# Patient Record
Sex: Female | Born: 1975 | Race: White | Hispanic: No | Marital: Married | Smoking: Never smoker
Health system: Southern US, Community
[De-identification: ages and names within clinical notes are randomized; demographics above are authoritative.]

## PROBLEM LIST (undated history)

## (undated) DIAGNOSIS — F419 Anxiety disorder, unspecified: Secondary | ICD-10-CM

---

## 2015-05-15 ENCOUNTER — Inpatient Hospital Stay (HOSPITAL_COMMUNITY)
Admission: EM | Admit: 2015-05-15 | Discharge: 2015-05-17 | DRG: 917 | Payer: 59 | Attending: Pulmonary Disease | Admitting: Pulmonary Disease

## 2015-05-15 ENCOUNTER — Encounter (HOSPITAL_COMMUNITY): Payer: Self-pay | Admitting: Nurse Practitioner

## 2015-05-15 ENCOUNTER — Emergency Department (HOSPITAL_COMMUNITY): Payer: 59

## 2015-05-15 DIAGNOSIS — Y907 Blood alcohol level of 200-239 mg/100 ml: Secondary | ICD-10-CM | POA: Diagnosis present

## 2015-05-15 DIAGNOSIS — T50901A Poisoning by unspecified drugs, medicaments and biological substances, accidental (unintentional), initial encounter: Secondary | ICD-10-CM | POA: Diagnosis not present

## 2015-05-15 DIAGNOSIS — J040 Acute laryngitis: Secondary | ICD-10-CM | POA: Diagnosis present

## 2015-05-15 DIAGNOSIS — Z820 Family history of epilepsy and other diseases of the nervous system: Secondary | ICD-10-CM | POA: Diagnosis not present

## 2015-05-15 DIAGNOSIS — T43502A Poisoning by unspecified antipsychotics and neuroleptics, intentional self-harm, initial encounter: Secondary | ICD-10-CM

## 2015-05-15 DIAGNOSIS — I4581 Long QT syndrome: Secondary | ICD-10-CM | POA: Diagnosis present

## 2015-05-15 DIAGNOSIS — T1491 Suicide attempt: Secondary | ICD-10-CM | POA: Diagnosis not present

## 2015-05-15 DIAGNOSIS — R4182 Altered mental status, unspecified: Secondary | ICD-10-CM | POA: Diagnosis present

## 2015-05-15 DIAGNOSIS — T43501A Poisoning by unspecified antipsychotics and neuroleptics, accidental (unintentional), initial encounter: Secondary | ICD-10-CM | POA: Diagnosis present

## 2015-05-15 DIAGNOSIS — Z800 Family history of malignant neoplasm of digestive organs: Secondary | ICD-10-CM

## 2015-05-15 DIAGNOSIS — F10129 Alcohol abuse with intoxication, unspecified: Secondary | ICD-10-CM | POA: Diagnosis present

## 2015-05-15 DIAGNOSIS — Z9289 Personal history of other medical treatment: Secondary | ICD-10-CM

## 2015-05-15 DIAGNOSIS — T43592A Poisoning by other antipsychotics and neuroleptics, intentional self-harm, initial encounter: Secondary | ICD-10-CM | POA: Diagnosis present

## 2015-05-15 DIAGNOSIS — F329 Major depressive disorder, single episode, unspecified: Secondary | ICD-10-CM | POA: Diagnosis present

## 2015-05-15 DIAGNOSIS — T43201A Poisoning by unspecified antidepressants, accidental (unintentional), initial encounter: Secondary | ICD-10-CM | POA: Diagnosis present

## 2015-05-15 DIAGNOSIS — T43222A Poisoning by selective serotonin reuptake inhibitors, intentional self-harm, initial encounter: Secondary | ICD-10-CM | POA: Diagnosis not present

## 2015-05-15 DIAGNOSIS — E878 Other disorders of electrolyte and fluid balance, not elsewhere classified: Secondary | ICD-10-CM | POA: Diagnosis present

## 2015-05-15 DIAGNOSIS — Z79899 Other long term (current) drug therapy: Secondary | ICD-10-CM | POA: Diagnosis not present

## 2015-05-15 DIAGNOSIS — R9431 Abnormal electrocardiogram [ECG] [EKG]: Secondary | ICD-10-CM | POA: Diagnosis present

## 2015-05-15 DIAGNOSIS — Z811 Family history of alcohol abuse and dependence: Secondary | ICD-10-CM | POA: Diagnosis not present

## 2015-05-15 DIAGNOSIS — F419 Anxiety disorder, unspecified: Secondary | ICD-10-CM | POA: Diagnosis present

## 2015-05-15 DIAGNOSIS — J9600 Acute respiratory failure, unspecified whether with hypoxia or hypercapnia: Secondary | ICD-10-CM | POA: Diagnosis present

## 2015-05-15 DIAGNOSIS — T43202A Poisoning by unspecified antidepressants, intentional self-harm, initial encounter: Secondary | ICD-10-CM | POA: Diagnosis not present

## 2015-05-15 DIAGNOSIS — G9340 Encephalopathy, unspecified: Secondary | ICD-10-CM | POA: Diagnosis present

## 2015-05-15 DIAGNOSIS — T43591A Poisoning by other antipsychotics and neuroleptics, accidental (unintentional), initial encounter: Secondary | ICD-10-CM | POA: Diagnosis not present

## 2015-05-15 HISTORY — DX: Anxiety disorder, unspecified: F41.9

## 2015-05-15 LAB — COMPREHENSIVE METABOLIC PANEL
ALBUMIN: 3.9 g/dL (ref 3.5–5.0)
ALT: 20 U/L (ref 14–54)
AST: 26 U/L (ref 15–41)
Alkaline Phosphatase: 39 U/L (ref 38–126)
Anion gap: 9 (ref 5–15)
BILIRUBIN TOTAL: 0.5 mg/dL (ref 0.3–1.2)
BUN: 10 mg/dL (ref 6–20)
CHLORIDE: 113 mmol/L — AB (ref 101–111)
CO2: 23 mmol/L (ref 22–32)
Calcium: 8 mg/dL — ABNORMAL LOW (ref 8.9–10.3)
Creatinine, Ser: 0.59 mg/dL (ref 0.44–1.00)
GFR calc Af Amer: >60 mL/min (ref >60)
GFR calc non Af Amer: >60 mL/min (ref >60)
GLUCOSE: 143 mg/dL — AB (ref 65–99)
POTASSIUM: 3.7 mmol/L (ref 3.5–5.1)
SODIUM: 145 mmol/L (ref 135–145)
TOTAL PROTEIN: 6.6 g/dL (ref 6.5–8.1)

## 2015-05-15 LAB — CBG MONITORING, ED: Glucose-Capillary: 135 mg/dL — ABNORMAL HIGH (ref 65–99)

## 2015-05-15 LAB — BLOOD GAS, ARTERIAL
ACID-BASE DEFICIT: 4.6 mmol/L — AB (ref 0.0–2.0)
Bicarbonate: 20.1 mEq/L (ref 20.0–24.0)
FIO2: 1
LHR: 16 {breaths}/min
MECHVT: 440 mL
O2 SAT: 99.7 %
PCO2 ART: 37 mmHg (ref 35.0–45.0)
PEEP: 5 cmH2O
PO2 ART: 528 mmHg — AB (ref 80.0–100.0)
Patient temperature: 97.3
TCO2: 18.3 mmol/L (ref 0–100)
pH, Arterial: 7.351 (ref 7.350–7.450)

## 2015-05-15 LAB — RAPID URINE DRUG SCREEN, HOSP PERFORMED
AMPHETAMINES: NOT DETECTED
BARBITURATES: NOT DETECTED
Benzodiazepines: NOT DETECTED
Cocaine: NOT DETECTED
Opiates: NOT DETECTED
TETRAHYDROCANNABINOL: NOT DETECTED

## 2015-05-15 LAB — CBC WITH DIFFERENTIAL/PLATELET
BASOS ABS: 0 10*3/uL (ref 0.0–0.1)
BASOS PCT: 0 %
Eosinophils Absolute: 0 10*3/uL (ref 0.0–0.7)
Eosinophils Relative: 1 %
HEMATOCRIT: 38.5 % (ref 36.0–46.0)
Hemoglobin: 13.1 g/dL (ref 12.0–15.0)
Lymphocytes Relative: 35 %
Lymphs Abs: 1.4 10*3/uL (ref 0.7–4.0)
MCH: 33.5 pg (ref 26.0–34.0)
MCHC: 34 g/dL (ref 30.0–36.0)
MCV: 98.5 fL (ref 78.0–100.0)
MONO ABS: 0.2 10*3/uL (ref 0.1–1.0)
Monocytes Relative: 4 %
NEUTROS ABS: 2.5 10*3/uL (ref 1.7–7.7)
NEUTROS PCT: 60 %
Platelets: 188 10*3/uL (ref 150–400)
RBC: 3.91 MIL/uL (ref 3.87–5.11)
RDW: 12 % (ref 11.5–15.5)
WBC: 4.1 10*3/uL (ref 4.0–10.5)

## 2015-05-15 LAB — ETHANOL: Alcohol, Ethyl (B): 266 mg/dL — ABNORMAL HIGH (ref <5)

## 2015-05-15 LAB — ACETAMINOPHEN LEVEL: Acetaminophen (Tylenol), Serum: <10 ug/mL — ABNORMAL LOW (ref 10–30)

## 2015-05-15 LAB — I-STAT BETA HCG BLOOD, ED (MC, WL, AP ONLY)

## 2015-05-15 LAB — SALICYLATE LEVEL: Salicylate Lvl: <4 mg/dL (ref 2.8–30.0)

## 2015-05-15 MED ORDER — LORAZEPAM 2 MG/ML IJ SOLN
INTRAMUSCULAR | Status: AC
  Administered 2015-05-15: 21:00:00
  Filled 2015-05-15: qty 1

## 2015-05-15 MED ORDER — DOCUSATE SODIUM 100 MG PO CAPS: 100.0000 mg | ORAL_CAPSULE | Freq: Two times a day (BID) | ORAL | Status: DC | PRN

## 2015-05-15 MED ORDER — FENTANYL CITRATE (PF) 100 MCG/2ML IJ SOLN
100.0000 ug | INTRAMUSCULAR | Status: DC | PRN
  Administered 2015-05-16: 50 ug via INTRAVENOUS
  Filled 2015-05-15 (×2): qty 2

## 2015-05-15 MED ORDER — HEPARIN SODIUM (PORCINE) 5000 UNIT/ML IJ SOLN
5000.0000 [IU] | Freq: Three times a day (TID) | INTRAMUSCULAR | Status: DC
  Administered 2015-05-15 – 2015-05-17 (×5): 5000 [IU] via SUBCUTANEOUS
  Filled 2015-05-15 (×5): qty 1

## 2015-05-15 MED ORDER — SODIUM CHLORIDE 0.9 % IV SOLN
INTRAVENOUS | Status: DC
  Administered 2015-05-15: 22:00:00 via INTRAVENOUS

## 2015-05-15 MED ORDER — PANTOPRAZOLE SODIUM 40 MG IV SOLR: 40.0000 mg | Freq: Every day | INTRAVENOUS | Status: DC

## 2015-05-15 MED ORDER — FENTANYL CITRATE (PF) 100 MCG/2ML IJ SOLN
100.0000 ug | INTRAMUSCULAR | Status: DC | PRN
  Administered 2015-05-16: 50 ug via INTRAVENOUS
  Filled 2015-05-15: qty 2

## 2015-05-15 MED ORDER — DEXMEDETOMIDINE HCL IN NACL 200 MCG/50ML IV SOLN
0.4000 ug/kg/h | INTRAVENOUS | Status: DC
  Administered 2015-05-15: 0.4 ug/kg/h via INTRAVENOUS
  Administered 2015-05-16: 0.3 ug/kg/h via INTRAVENOUS
  Filled 2015-05-15 (×2): qty 50

## 2015-05-15 MED ORDER — PANTOPRAZOLE SODIUM 40 MG IV SOLR
40.0000 mg | Freq: Every day | INTRAVENOUS | Status: DC
  Administered 2015-05-15: 40 mg via INTRAVENOUS
  Filled 2015-05-15: qty 40

## 2015-05-15 MED ORDER — DEXMEDETOMIDINE HCL IN NACL 200 MCG/50ML IV SOLN
0.4000 ug/kg/h | INTRAVENOUS | Status: DC
  Filled 2015-05-15: qty 50

## 2015-05-15 MED ORDER — SODIUM CHLORIDE 0.9 % IV BOLUS (SEPSIS)
1000.0000 mL | Freq: Once | INTRAVENOUS | Status: AC
  Administered 2015-05-15: 1000 mL via INTRAVENOUS

## 2015-05-15 NOTE — ED Notes (Signed)
Pt was given 61m of narcan at 1932. For intubation pt was given 252mof etomidate at 1943 and 12576mf succinylcholine at 1944. Pt was intubate at [place] 1953 2mg27m ativan was given.

## 2015-05-15 NOTE — ED Provider Notes (Signed)
CSN: [id number]     Arrival date & time 05/15/15  1925 History   First MD Initiated Contact with Patient 05/15/15 1948     Chief Complaint  Patient presents with  . Ingestion    Lexapro, Seroquel, Etoh    Level V caveat secondary to altered mental status and acute need for intervention (Consider location/radiation/quality/duration/timing/severity/associated sxs/prior Treatment) HPI   39 year old female who was found confused by her husband. He reports that she had drank approximately a bottle of wine earlier today which was not unusual for her over the weekend. He left her at approximately 4:30 to take their children to church. She called her girlfriend sounded confused. He returned home at around 6:30 and found her confused with bottles of Lexapro and Seroquel. She has been on the Lexapro and he has had this Seroquel for sleep at night. There were multiple pills missing from each. He denies that she has ever taken an overdose or attempt to harm herself in the past. He denies that she was voicing any intent to harm herself earlier today. EMS reports that the call came in at approximately 640. They state on their arrival she was awake but confused. She has become somnolent and route. History reviewed. No pertinent past medical history. History reviewed. No pertinent past surgical history. History reviewed. No pertinent family history. Social History  Substance Use Topics  . Smoking status: Never Smoker   . Smokeless tobacco: None  . Alcohol Use: Yes   OB History    No data available     Review of Systems  Unable to perform ROS: Acuity of condition      Allergies  Review of patient's allergies indicates no known allergies.  Home Medications   Prior to Admission medications   Not on File   BP 128/75 mmHg  Pulse 154  Resp 22  Wt 119 lb (53.978 kg)  SpO2 100%  LMP 05/01/2015 (Approximate) Physical Exam  Constitutional: She appears well-developed and well-nourished. She  appears distressed.  HENT:  Head: Normocephalic and atraumatic.  Mouth/Throat: Oropharynx is clear and moist.  Eyes:  Pupils are approximately 3 and reactive  Neck: Normal range of motion. Neck supple.  Cardiovascular:  Tachycardia  Pulmonary/Chest: She has no wheezes. She has no rales.  Patient's respirations are sonorous with intermittent apnea  Abdominal: Soft.  Musculoskeletal: Normal range of motion. She exhibits no edema.  Neurological:  Patient is not responsive to verbal stimuli. He is moving all 4 extremities. She has intermittent jerking movements and some tonic movements  Skin: Skin is warm. No rash noted. No erythema. There is pallor.  Nursing note and vitals reviewed.   ED Course  .Intubation Date/Time: 05/15/2015 8:28 PM Performed by: Pattricia Boss Authorized by: Pattricia Boss Consent: The procedure was performed in an emergent situation. Time out: Immediately prior to procedure a "time out" was called to verify the correct patient, procedure, equipment, support staff and site/side marked as required. Indications: airway protection Intubation method: video-assisted Patient status: paralyzed (RSI) Preoxygenation: nonrebreather mask Sedatives: etomidate Paralytic: succinylcholine Tube size: 8.0 mm Tube type: cuffed Number of attempts: 1 Cricoid pressure: yes Cords visualized: yes Post-procedure assessment: chest rise and ETCO2 monitor Breath sounds: equal Cuff inflated: yes ETT to lip: 22 cm Tube secured with: ETT holder Chest x-Suresh Audi interpreted by radiologist. Chest x-Yarel Rushlow findings: endotracheal tube too high Tube repositioned: tube repositioned successfully Patient tolerance: Patient tolerated the procedure well with no immediate complications   (including critical care time) Labs Review Labs  Reviewed  CBG MONITORING, ED - Abnormal; Notable for the following:    Glucose-Capillary 135 (*)    All other components within normal limits  BLOOD GAS, ARTERIAL   CBC WITH DIFFERENTIAL/PLATELET  COMPREHENSIVE METABOLIC PANEL  URINE RAPID DRUG SCREEN, HOSP PERFORMED  ETHANOL  SALICYLATE LEVEL  ACETAMINOPHEN LEVEL  I-STAT BETA HCG BLOOD, ED ([facility], [facility], [facility] ONLY)    Imaging Review Dg Chest Portable 1 View  05/15/2015  CLINICAL DATA:  Intubation.  NG placement.  Drug congestion EXAM: PORTABLE CHEST 1 VIEW COMPARISON:  None. FINDINGS: Endotracheal tube 11 cm above the carina. Recommend advancing endotracheal tube 7 cm for proper placement. NG tube in the proximal stomach.  Side hole near the GE junction. Lungs are clear. Negative for infiltrate effusion or mass. Heart size normal. IMPRESSION: Endotracheal tube 11 cm above the carina, recommend advancing 7 cm Lungs are clear NG tube in the proximal stomach. Electronically Signed   By: Franchot Gallo M.D.   On: 05/15/2015 20:20   I have personally reviewed and evaluated these images and lab results as part of my medical decision-making.   EKG Interpretation   Date/Time:  Sunday May 15 2015 19:25:31 EDT Ventricular Rate:  157 PR Interval:  100 QRS Duration: 80 QT Interval:  348 QTC Calculation: 562 R Axis:   91 Text Interpretation:  Sinus tachycardia Borderline right axis deviation  Minimal ST depression, diffuse leads Prolonged QT interval Confirmed by  Franci Oshana MD, Andee Poles ([id number]) on 05/15/2015 8:23:04 PM      MDM  Overdose  Tricyclic od seroquel od Intubation to protect airway Altered level of consciousness with history of overdose. Patient received Ativan 2 mg IV prior to intubation. Intubation was done with etomidate and succinylcholine. Content the patient has had sedation with Precedex. She currently has labs pending. Patient is being monitored. QT appears prolonged on her current EKG and this is side effect of both the Lexapro and Seroquel overdose. S patient care was discussed with Dr. Joya Gaskins and she will be admitted to the ICU.  Her husband was present initially at bedside and I have  discussed the care with him both before and after intubation.  8:26 PM Patient calm, hr 140s, bp 134/79  Labs pending.  Awaiting critical care.   CRITICAL CARE Performed by: Shaune Pollack Total critical care time: 75 minutes Critical care time was exclusive of separately billable procedures and treating other patients. Critical care was necessary to treat or prevent imminent or life-threatening deterioration. Critical care was time spent personally by me on the following activities: development of treatment plan with patient and/or surrogate as well as nursing, discussions with consultants, evaluation of patient's response to treatment, examination of patient, obtaining history from patient or surrogate, ordering and performing treatments and interventions, ordering and review of laboratory studies, ordering and review of radiographic studies, pulse oximetry and re-evaluation of patient's condition.   Pattricia Boss, MD 05/15/15 2029

## 2015-05-15 NOTE — ED Notes (Signed)
Bed: RESA Expected date:  Expected time:  Means of arrival:  Comments: OD- ETOH, Lexapro, Seroquel

## 2015-05-15 NOTE — Progress Notes (Signed)
[facility] Progress Note Patient Name: Kristine Burton DOB: 12/04/1975 MRN: [id number]   Date of Service  05/15/2015  HPI/Events of Note  Pt admitted with overdose of seroquel, lexapro, ETOH.  Pt on vent  eICU Interventions  See orders, see full pccm H and P to follow      Intervention Category Evaluation Type: New Patient Evaluation  Asencion Noble 05/15/2015, 8:28 PM

## 2015-05-15 NOTE — ED Notes (Signed)
Pt is presently emergently, report of drug ingestion with unknown intension, drugs ingested include Lexapro (uknown amount) and Seroquel(provale 40tablets), and a glass of wine. All theses is reported 2 hours PTA. GCS of less than 13 on arrival, on non-rebreather to maintain airway per medics. Poison control being contacted.

## 2015-05-15 NOTE — ED Notes (Signed)
Called [place]'s poison control and the recommendations are as follows: for the 20 mg Lexapro, at least 24 hrs observation, anticipate QT prolonging, seizure precaution, for chemistries magnesium and potasium to be preferably of the higher end of the norm. For Seroquel: QT elongation, anticipate sinus tachycardia, seizure with or without hyperactivity, check CK and liver enzymes, incase of seizure benzos to be administered and for persisting seizure phenobarb is recommended.

## 2015-05-15 NOTE — H&P (Signed)
PULMONARY / CRITICAL CARE MEDICINE   Name: Kristine Burton MRN: [id number] DOB: 12-Sep-1975    ADMISSION DATE:  05/15/2015  REFERRING MD :  EDP Ray  CHIEF COMPLAINT:  Overdose lexapro/seroquel  INITIAL PRESENTATION:  39 y.o.F with anxiety hx, ETOH use earlier then took overdose intentionally of 45-50 seroquel and lexapro ? Amount.  Arrived ED sonorous respirs and intubated per EDP. CCM asked to admit  STUDIES:  none  SIGNIFICANT EVENTS:    HISTORY OF PRESENT ILLNESS:   39 y.o.F with anxiety hx, ETOH use earlier then took overdose intentionally of 45-50 seroquel and lexapro ? Amount.  Arrived ED sonorous respirs and intubated per EDP. CCM asked to admit.  Pt on vent. Hemodynamically stable with prolonged QTc   PAST MEDICAL HISTORY :   has a past medical history of Anxiety.  has no past surgical history on file. Prior to Admission medications   Medication Sig Start Date End Date Taking? Authorizing Provider  escitalopram (LEXAPRO) 20 MG tablet Take 20 mg by mouth daily. 03/27/15  Yes Historical Provider, MD   No Known Allergies  FAMILY HISTORY:  has no family status information on file.  SOCIAL HISTORY:  reports that she has never smoked. She does not have any smokeless tobacco history on file. She reports that she drinks alcohol.  REVIEW OF SYSTEMS:   Not obtainable d/t AMS  SUBJECTIVE:   VITAL SIGNS: Temp:  [96.6 F (35.9 C)-98.2 F (36.8 C)] 98.2 F (36.8 C) (10/30 2212) Pulse Rate:  [96-155] 109 (10/31 0338) Resp:  [15-39] 16 (10/31 0338) BP: (82-161)/(49-84) 106/72 mmHg (10/31 0338) SpO2:  [99 %-100 %] 100 % (10/31 0338) FiO2 (%):  [30 %-100 %] 30 % (10/31 0338) Weight:  [53.978 kg (119 lb)] 53.978 kg (119 lb) (10/30 1932) HEMODYNAMICS:   VENTILATOR SETTINGS: Vent Mode:  [-] PRVC FiO2 (%):  [30 %-100 %] 30 % Set Rate:  [16 bmp] 16 bmp Vt Set:  [440 mL] 440 mL PEEP:  [5 cmH20] 5 cmH20 Plateau Pressure:  [10 cmH20-12 cmH20] 11 cmH20 INTAKE /  OUTPUT:  Intake/Output Summary (Last 24 hours) at 05/16/15 2025 Last data filed at 05/15/15 2300  Gross per 24 hour  Intake  50.84 ml  Output      0 ml  Net  50.84 ml    PHYSICAL EXAMINATION: General:  Sedated on vent, acutely ill appearing female.  Opens eyes but otherwise not following commands. Neuro:  Opens eyes, withdraws to pain not command, non-focal exam. HEENT:  [place]/AT, PERRL, EOM-spontaneous and MMM. Cardiovascular:  RRR, Nl S1/S2, -M/R/G. Lungs:  CTA bilaterally.  ETT 6 above carina. Abdomen:  Soft, NT, ND and +BS. Musculoskeletal:  -edema and -tenderness. Skin:  Intact.  LABS:  CBC  Recent Labs Lab 05/15/15 1944 05/16/15 0500  WBC 4.1 6.8  HGB 13.1 11.8*  HCT 38.5 35.5*  PLT 188 179   Coag's No results for input(s): APTT, INR in the last 168 hours. BMET  Recent Labs Lab 05/15/15 1944 05/16/15 0500  NA 145 141  K 3.7 3.7  CL 113* 113*  CO2 23 22  BUN 10 8  CREATININE 0.59 0.52  GLUCOSE 143* 87   Electrolytes  Recent Labs Lab 05/15/15 1944 05/16/15 0500  CALCIUM 8.0* 7.2*  MG  --  1.7   Sepsis Markers No results for input(s): LATICACIDVEN, PROCALCITON, O2SATVEN in the last 168 hours. ABG  Recent Labs Lab 05/15/15 2020  PHART 7.351  PCO2ART 37.0  PO2ART 528*   Liver  Enzymes  Recent Labs Lab 05/15/15 1944  AST 26  ALT 20  ALKPHOS 39  BILITOT 0.5  ALBUMIN 3.9   Cardiac Enzymes  Recent Labs Lab 05/15/15 2339 05/16/15 0500  TROPONINI <0.03 <0.03   Glucose  Recent Labs Lab 05/15/15 1928  GLUCAP 135*   Imaging Dg Chest Portable 1 View  05/15/2015  CLINICAL DATA:  Intubation.  NG placement.  Drug congestion EXAM: PORTABLE CHEST 1 VIEW COMPARISON:  None. FINDINGS: Endotracheal tube 11 cm above the carina. Recommend advancing endotracheal tube 7 cm for proper placement. NG tube in the proximal stomach.  Side hole near the GE junction. Lungs are clear. Negative for infiltrate effusion or mass. Heart size normal.  IMPRESSION: Endotracheal tube 11 cm above the carina, recommend advancing 7 cm Lungs are clear NG tube in the proximal stomach. Electronically Signed   By: Franchot Gallo M.D.   On: 05/15/2015 20:20   ASSESSMENT / PLAN: Principal Problem:   Overdose of antipsychotic Active Problems:   Overdose of antidepressant   Altered mental status   Acute respiratory failure (HCC)   Polysubstance overdose   QT prolongation  PULMONARY OETT 10/30>> A:Acute respiratory failure d/t polysubstance overdose of ETOH, Lexapro, seroquel P:   Full vent support. See vent orders. ABG and adjust vent accordingly. Advance tube 2 cm down towards carina from 10/31 AM CXR.  CARDIOVASCULAR CVL  NONE A: hemodynamically stable, QTc interval prolonged P:  Monitor cardiac status Avoid QTc prolonging agents Troponin negative  RENAL A:  Low Mg and borderline K. P:   No acute issues BMET in AM. Replace electrolytes as indicated.  GASTROINTESTINAL A:  No acute issues P:   NPO If intubated by AM then start TF.  HEMATOLOGIC A:  No issues P:  Monitor CBC. Transfuse per ICU protocol.  INFECTIOUS A:  No issues P:   Monitor off abx  ENDOCRINE A:  No issues   P:   Monitor  NEUROLOGIC A:  AMS d/t Overdose P:   RASS goal: -1 Monitor on precedex PRN fentanyl added. Will need a sitter and psych called when patient is more awake.  FAMILY  - Updates: Husband updated at length bedside.  The patient is critically ill with multiple organ systems failure and requires high complexity decision making for assessment and support, frequent evaluation and titration of therapies, application of advanced monitoring technologies and extensive interpretation of multiple databases.   Critical Care Time devoted to patient care services described in this note is  35  Minutes. This time reflects time of care of this signee Dr Jennet Maduro. This critical care time does not reflect procedure time, or teaching time or  supervisory time of PA/NP/Med student/Med Resident etc but could involve care discussion time.  Rush Farmer, M.D. [facility] Pulmonary/Critical Care Medicine. Pager: [phone]. After hours pager: [phone].  05/16/2015, 6:21 AM

## 2015-05-16 ENCOUNTER — Inpatient Hospital Stay (HOSPITAL_COMMUNITY): Payer: 59

## 2015-05-16 DIAGNOSIS — T50901A Poisoning by unspecified drugs, medicaments and biological substances, accidental (unintentional), initial encounter: Secondary | ICD-10-CM

## 2015-05-16 DIAGNOSIS — F10129 Alcohol abuse with intoxication, unspecified: Secondary | ICD-10-CM | POA: Diagnosis present

## 2015-05-16 DIAGNOSIS — J9600 Acute respiratory failure, unspecified whether with hypoxia or hypercapnia: Secondary | ICD-10-CM

## 2015-05-16 DIAGNOSIS — I4581 Long QT syndrome: Secondary | ICD-10-CM

## 2015-05-16 DIAGNOSIS — T43592A Poisoning by other antipsychotics and neuroleptics, intentional self-harm, initial encounter: Secondary | ICD-10-CM

## 2015-05-16 DIAGNOSIS — T1491 Suicide attempt: Secondary | ICD-10-CM

## 2015-05-16 DIAGNOSIS — T50902A Poisoning by unspecified drugs, medicaments and biological substances, intentional self-harm, initial encounter: Secondary | ICD-10-CM

## 2015-05-16 DIAGNOSIS — T43202A Poisoning by unspecified antidepressants, intentional self-harm, initial encounter: Secondary | ICD-10-CM

## 2015-05-16 DIAGNOSIS — R4182 Altered mental status, unspecified: Secondary | ICD-10-CM

## 2015-05-16 DIAGNOSIS — T43222A Poisoning by selective serotonin reuptake inhibitors, intentional self-harm, initial encounter: Principal | ICD-10-CM

## 2015-05-16 DIAGNOSIS — T43502A Poisoning by unspecified antipsychotics and neuroleptics, intentional self-harm, initial encounter: Secondary | ICD-10-CM

## 2015-05-16 LAB — PHOSPHORUS: PHOSPHORUS: 2.3 mg/dL — AB (ref 2.5–4.6)

## 2015-05-16 LAB — CBC
HEMATOCRIT: 35.5 % — AB (ref 36.0–46.0)
HEMOGLOBIN: 11.8 g/dL — AB (ref 12.0–15.0)
MCH: 33.1 pg (ref 26.0–34.0)
MCHC: 33.2 g/dL (ref 30.0–36.0)
MCV: 99.4 fL (ref 78.0–100.0)
Platelets: 179 10*3/uL (ref 150–400)
RBC: 3.57 MIL/uL — AB (ref 3.87–5.11)
RDW: 12.1 % (ref 11.5–15.5)
WBC: 6.8 10*3/uL (ref 4.0–10.5)

## 2015-05-16 LAB — BASIC METABOLIC PANEL
Anion gap: 6 (ref 5–15)
BUN: 8 mg/dL (ref 6–20)
CHLORIDE: 113 mmol/L — AB (ref 101–111)
CO2: 22 mmol/L (ref 22–32)
Calcium: 7.2 mg/dL — ABNORMAL LOW (ref 8.9–10.3)
Creatinine, Ser: 0.52 mg/dL (ref 0.44–1.00)
GFR calc Af Amer: >60 mL/min (ref >60)
GFR calc non Af Amer: >60 mL/min (ref >60)
GLUCOSE: 87 mg/dL (ref 65–99)
POTASSIUM: 3.7 mmol/L (ref 3.5–5.1)
Sodium: 141 mmol/L (ref 135–145)

## 2015-05-16 LAB — MRSA PCR SCREENING: MRSA by PCR: NEGATIVE

## 2015-05-16 LAB — TROPONIN I
Troponin I: <0.03 ng/mL (ref <0.031)
Troponin I: <0.03 ng/mL (ref <0.031)

## 2015-05-16 LAB — MAGNESIUM: Magnesium: 1.7 mg/dL (ref 1.7–2.4)

## 2015-05-16 MED ORDER — INFLUENZA VAC SPLIT QUAD 0.5 ML IM SUSY
0.5000 mL | PREFILLED_SYRINGE | INTRAMUSCULAR | Status: AC
  Administered 2015-05-17: 0.5 mL via INTRAMUSCULAR
  Filled 2015-05-16 (×2): qty 0.5

## 2015-05-16 MED ORDER — POTASSIUM CHLORIDE 10 MEQ/100ML IV SOLN
10.0000 meq | INTRAVENOUS | Status: AC
  Administered 2015-05-16 (×3): 10 meq via INTRAVENOUS
  Filled 2015-05-16 (×3): qty 100

## 2015-05-16 MED ORDER — SODIUM CHLORIDE 0.9 % IV BOLUS (SEPSIS)
1000.0000 mL | Freq: Once | INTRAVENOUS | Status: AC
  Administered 2015-05-16: 1000 mL via INTRAVENOUS

## 2015-05-16 MED ORDER — ANTISEPTIC ORAL RINSE SOLUTION (CORINZ)
7.0000 mL | Freq: Four times a day (QID) | OROMUCOSAL | Status: DC
  Administered 2015-05-16 – 2015-05-17 (×3): 7 mL via OROMUCOSAL

## 2015-05-16 MED ORDER — POTASSIUM CHLORIDE CRYS ER 20 MEQ PO TBCR
40.0000 meq | EXTENDED_RELEASE_TABLET | Freq: Three times a day (TID) | ORAL | Status: AC
  Administered 2015-05-16 (×2): 40 meq via ORAL
  Filled 2015-05-16 (×3): qty 2

## 2015-05-16 MED ORDER — LACTATED RINGERS IV SOLN
INTRAVENOUS | Status: DC
  Administered 2015-05-16 – 2015-05-17 (×2): via INTRAVENOUS

## 2015-05-16 MED ORDER — CHLORHEXIDINE GLUCONATE 0.12% ORAL RINSE (MEDLINE KIT)
15.0000 mL | Freq: Two times a day (BID) | OROMUCOSAL | Status: DC
  Administered 2015-05-16: 15 mL via OROMUCOSAL

## 2015-05-16 MED ORDER — MAGNESIUM SULFATE 2 GM/50ML IV SOLN
2.0000 g | Freq: Once | INTRAVENOUS | Status: AC
  Administered 2015-05-16: 2 g via INTRAVENOUS
  Filled 2015-05-16: qty 50

## 2015-05-16 NOTE — Progress Notes (Signed)
Initial Nutrition Assessment  DOCUMENTATION CODES:   Not applicable  INTERVENTION:  - If pt to remain intubated >24 hours, recommend Jevity 1.2 @ 45 ml/hr with 30 mL Prostat once/day to provide 1396 kcal (96% estimated needs), 75 grams protein, and 871 mL free water. - RD will continue to monitor for needs  NUTRITION DIAGNOSIS:   Inadequate oral intake related to inability to eat as evidenced by NPO status.  GOAL:   Patient will meet greater than or equal to 90% of their needs  MONITOR:   Vent status, Weight trends, Labs, I & O's  REASON FOR ASSESSMENT:   Ventilator  ASSESSMENT:   39 y.o.F with anxiety hx, ETOH use earlier then took overdose intentionally of 45-50 seroquel and lexapro ? Amount. Arrived ED sonorous respirs and intubated per EDP. CCM asked to admit  Pt seen for new vent. BMI indicates normal weight status.  Patient is currently intubated on ventilator support MV: 6.6 L/min Temp (24hrs), Avg:98.3 F (36.8 C), Min:96.6 F (35.9 C), Max:99.7 F (37.6 C)  Propofol: none  Spoke with pt's husband who was at bedside at time of RD visit. He states that PTA pt's appetite had been normal for her, although she has never eaten that much due to "being smaller." He states that her weight fluctuates between 118 lbs and 123 lbs. Noted admission weight of 119 lbs yesterday (10/30) and current weight of 128 lbs; likely fluid versus scale difference related. No muscle or fat wasting noted. No edema.   Pt unable to meet needs at this time. TF recommendations outlined above should pt remain intubated for >24 more hours. OGT in place. Medications reviewed. Labs reviewed; Cl: 113 mmol/L, Ca: 7.2 mg/dL, Phos: 2.3 mg/dL.   Diet Order:  Diet NPO time specified  Skin:  Reviewed, no issues  Last BM:  PTA  Height:   Ht Readings from Last 1 Encounters:  05/15/15 5' 3"  (1.6 m)    Weight:   Wt Readings from Last 1 Encounters:  05/16/15 128 lb 4.9 oz (58.2 kg)    Ideal  Body Weight:  52.27 kg (kg)  BMI:  Body mass index is 22.73 kg/(m^2).  Estimated Nutritional Needs:   Kcal:  1451  Protein:  70-87 grams  Fluid:  1.8-2 L/day  EDUCATION NEEDS:   No education needs identified at this time     Jarome Matin, RD, LDN Inpatient Clinical Dietitian Pager # [phone] After hours/weekend pager # [phone]

## 2015-05-16 NOTE — Consult Note (Addendum)
[facility] Consult   Reason for Consult:  Alcohol intoxication status post intentional overdose of Lexapro and Seroquel Referring Physician:  Dr. Lake Bells Patient Identification: Kristine Burton MRN:  [id number] Principal Diagnosis: Polysubstance overdose Diagnosis:   Patient Active Problem List   Diagnosis Date Noted  . Overdose of antidepressant [T43.201A] 05/15/2015  . Overdose of antipsychotic [T43.501A] 05/15/2015  . Altered mental status [R41.82] 05/15/2015  . Acute respiratory failure ([place]) [J96.00] 05/15/2015  . Polysubstance overdose [T50.901A] 05/15/2015  . QT prolongation [I45.81] 05/15/2015    Total Time spent with patient: 45 minutes  Subjective:   Kristine Burton is a 39 y.o. female patient admitted with confusion, alcohol Intoxication and status post intentional overdose.  HPI: Kristine Burton is a 39 year old female seen, chart reviewed for face-to-face psychiatric consultation and evaluation of depression, anxiety, alcohol intoxication and status post unintentional polydrug (lexapro, seroquel while intoxicated with alcohol) overdose. Reportedly patient contacted one of her friend in a confused/altered mental state and then brought to the [facility] long emergency department. Patient required intubation on arrival to support her respirations. She was extubated couple of hours earlier to his evaluation. Patient complaining about sore throat, difficulty to talk smoothly but denied alcohol withdrawal symptoms. Patient reportedly drinking wine - one bottle a day during the weekend only for more than a year. Patient has been stressed about sharing custody of her younger daughter with her daughter's father every other week and her daughter has been showing behavioral problems like teenage attitude. Patient endorses symptoms of depression, anxious, sleep disturbance and getting black-out after drinking one glass of wine and also having short-term memory loss regarding the  incidents. Patient husband has been in traveling job which makes it more difficult for her to manage home, children and her stresses. Patient blood alcohol Level is 226 on arrival to the emergency department. Urine drug screen is negative for drugs of abuse. Patient is currently on cardiac monitoring and receiving IV fluids. Patient may be medically stable by tomorrow.  Patient husband stated that he has been working in church for treatment [employer] celebration this weekend and she brought the children to church and husband found her intoxicated with alcohol so he drove her back home and let her lie down in her bed. Several hours later patient husband received a phone call from a common friend saying that she was confused and appeared with altered mental status. He returned home at around 6:30 and found her confused with empty bottles of Lexapro and Seroquel. There were multiple pills missing from each bottles. Reportedly found missing Lexapro which is prescribed to patient and Seroquel which is prescribed to her husband for sleeping. Patient does not recall intentional overdose of medication but reports she has taken 5-10 tabs of each which seems to be minimizing overdose. He denies history of overdose or attempt to harm herself.  Past Psychiatric History: Patient has no history of previous outpatient or inpatient mental health treatment but received outpatient treatment from the primary care physician.   Risk to Self: Is patient at risk for suicide?: No Risk to Others:   Prior Inpatient Therapy:   Prior Outpatient Therapy:    Past Medical History:  Past Medical History  Diagnosis Date  . Anxiety    History reviewed. No pertinent past surgical history. Family History: History reviewed. No pertinent family history. Family Psychiatric  History: Pt dad has parkinson disease and brother with alcohol abuse and questionable MS. She has uncle with pancreatic cancer. Social History:  History   Alcohol Use  . Yes     History  Drug Use Not on file    Social History   Social History  . Marital Status: N/A    Spouse Name: N/A  . Number of Children: N/A  . Years of Education: N/A   Social History Main Topics  . Smoking status: Never Smoker   . Smokeless tobacco: None  . Alcohol Use: Yes  . Drug Use: None  . Sexual Activity: Yes   Other Topics Concern  . None   Social History Narrative  . None   Additional Social History:                          Allergies:  No Known Allergies  Labs:  Results for orders placed or performed during the hospital encounter of 05/15/15 (from the past 48 hour(s))  CBG monitoring, ED     Status: Abnormal   Collection Time: 05/15/15  7:28 PM  Result Value Ref Range   Glucose-Capillary 135 (H) 65 - 99 mg/dL  CBC with Differential/Platelet     Status: None   Collection Time: 05/15/15  7:44 PM  Result Value Ref Range   WBC 4.1 4.0 - 10.5 K/uL   RBC 3.91 3.87 - 5.11 MIL/uL   Hemoglobin 13.1 12.0 - 15.0 g/dL   HCT 38.5 36.0 - 46.0 %   MCV 98.5 78.0 - 100.0 fL   MCH 33.5 26.0 - 34.0 pg   MCHC 34.0 30.0 - 36.0 g/dL   RDW 12.0 11.5 - 15.5 %   Platelets 188 150 - 400 K/uL   Neutrophils Relative % 60 %   Neutro Abs 2.5 1.7 - 7.7 K/uL   Lymphocytes Relative 35 %   Lymphs Abs 1.4 0.7 - 4.0 K/uL   Monocytes Relative 4 %   Monocytes Absolute 0.2 0.1 - 1.0 K/uL   Eosinophils Relative 1 %   Eosinophils Absolute 0.0 0.0 - 0.7 K/uL   Basophils Relative 0 %   Basophils Absolute 0.0 0.0 - 0.1 K/uL  Comprehensive metabolic panel     Status: Abnormal   Collection Time: 05/15/15  7:44 PM  Result Value Ref Range   Sodium 145 135 - 145 mmol/L   Potassium 3.7 3.5 - 5.1 mmol/L   Chloride 113 (H) 101 - 111 mmol/L   CO2 23 22 - 32 mmol/L   Glucose, Bld 143 (H) 65 - 99 mg/dL   BUN 10 6 - 20 mg/dL   Creatinine, Ser 0.59 0.44 - 1.00 mg/dL   Calcium 8.0 (L) 8.9 - 10.3 mg/dL   Total Protein 6.6 6.5 - 8.1 g/dL   Albumin 3.9 3.5 - 5.0  g/dL   AST 26 15 - 41 U/L   ALT 20 14 - 54 U/L   Alkaline Phosphatase 39 38 - 126 U/L   Total Bilirubin 0.5 0.3 - 1.2 mg/dL   GFR calc non Af Amer >60 >60 mL/min   GFR calc Af Amer >60 >60 mL/min    Comment: (NOTE) The eGFR has been calculated using the CKD EPI equation. This calculation has not been validated in all clinical situations. eGFR's persistently <60 mL/min signify possible Chronic Kidney Disease.    Anion gap 9 5 - 15  Urine rapid drug screen (hosp performed)     Status: None   Collection Time: 05/15/15  8:00 PM  Result Value Ref Range   Opiates NONE DETECTED NONE DETECTED   Cocaine  NONE DETECTED NONE DETECTED   Benzodiazepines NONE DETECTED NONE DETECTED   Amphetamines NONE DETECTED NONE DETECTED   Tetrahydrocannabinol NONE DETECTED NONE DETECTED   Barbiturates NONE DETECTED NONE DETECTED    Comment:        DRUG SCREEN FOR MEDICAL PURPOSES ONLY.  IF CONFIRMATION IS NEEDED FOR ANY PURPOSE, NOTIFY LAB WITHIN 5 DAYS.        LOWEST DETECTABLE LIMITS FOR URINE DRUG SCREEN Drug Class       Cutoff (ng/mL) Amphetamine      1000 Barbiturate      200 Benzodiazepine   098 Tricyclics       119 Opiates          300 Cocaine          300 THC              50   I-Stat Beta hCG blood, ED ([facility], [facility], [facility] only)     Status: None   Collection Time: 05/15/15  8:10 PM  Result Value Ref Range   I-stat hCG, quantitative <5.0 <5 mIU/mL   Comment 3            Comment:   GEST. AGE      CONC.  (mIU/mL)   <=1 WEEK        5 - 50     2 WEEKS       50 - 500     3 WEEKS       100 - 10,000     4 WEEKS     1,000 - 30,000        FEMALE AND NON-PREGNANT FEMALE:     LESS THAN 5 mIU/mL   Ethanol     Status: Abnormal   Collection Time: 05/15/15  8:15 PM  Result Value Ref Range   Alcohol, Ethyl (B) 266 (H) <5 mg/dL    Comment:        LOWEST DETECTABLE LIMIT FOR SERUM ALCOHOL IS 5 mg/dL FOR MEDICAL PURPOSES ONLY   Salicylate level     Status: None   Collection Time: 05/15/15  8:15 PM   Result Value Ref Range   Salicylate Lvl <1.4 2.8 - 30.0 mg/dL  Acetaminophen level     Status: Abnormal   Collection Time: 05/15/15  8:15 PM  Result Value Ref Range   Acetaminophen (Tylenol), Serum <10 (L) 10 - 30 ug/mL    Comment:        THERAPEUTIC CONCENTRATIONS VARY SIGNIFICANTLY. A RANGE OF 10-30 ug/mL MAY BE AN EFFECTIVE CONCENTRATION FOR MANY PATIENTS. HOWEVER, SOME ARE BEST TREATED AT CONCENTRATIONS OUTSIDE THIS RANGE. ACETAMINOPHEN CONCENTRATIONS >150 ug/mL AT 4 HOURS AFTER INGESTION AND >50 ug/mL AT 12 HOURS AFTER INGESTION ARE OFTEN ASSOCIATED WITH TOXIC REACTIONS.   Blood gas, arterial     Status: Abnormal   Collection Time: 05/15/15  8:20 PM  Result Value Ref Range   FIO2 1.00    Delivery systems VENTILATOR    Mode PRESSURE REGULATED VOLUME CONTROL    VT 440 mL   LHR 16 resp/min   Peep/cpap 5.0 cm H20   pH, Arterial 7.351 7.350 - 7.450   pCO2 arterial 37.0 35.0 - 45.0 mmHg   pO2, Arterial 528 (H) 80.0 - 100.0 mmHg   Bicarbonate 20.1 20.0 - 24.0 mEq/L   TCO2 18.3 0 - 100 mmol/L   Acid-base deficit 4.6 (H) 0.0 - 2.0 mmol/L   O2 Saturation 99.7 %   Patient temperature 97.3    Collection  site BRACHIAL ARTERY    Sample type ARTERIAL DRAW   MRSA PCR Screening     Status: None   Collection Time: 05/15/15  9:27 PM  Result Value Ref Range   MRSA by PCR NEGATIVE NEGATIVE    Comment:        The GeneXpert MRSA Assay (FDA approved for NASAL specimens only), is one component of a comprehensive MRSA colonization surveillance program. It is not intended to diagnose MRSA infection nor to guide or monitor treatment for MRSA infections.   Troponin I     Status: None   Collection Time: 05/15/15 11:39 PM  Result Value Ref Range   Troponin I <0.03 <0.031 ng/mL    Comment:        NO INDICATION OF MYOCARDIAL INJURY.   CBC     Status: Abnormal   Collection Time: 05/16/15  5:00 AM  Result Value Ref Range   WBC 6.8 4.0 - 10.5 K/uL   RBC 3.57 (L) 3.87 - 5.11  MIL/uL   Hemoglobin 11.8 (L) 12.0 - 15.0 g/dL   HCT 35.5 (L) 36.0 - 46.0 %   MCV 99.4 78.0 - 100.0 fL   MCH 33.1 26.0 - 34.0 pg   MCHC 33.2 30.0 - 36.0 g/dL   RDW 12.1 11.5 - 15.5 %   Platelets 179 150 - 400 K/uL  Basic metabolic panel     Status: Abnormal   Collection Time: 05/16/15  5:00 AM  Result Value Ref Range   Sodium 141 135 - 145 mmol/L   Potassium 3.7 3.5 - 5.1 mmol/L   Chloride 113 (H) 101 - 111 mmol/L   CO2 22 22 - 32 mmol/L   Glucose, Bld 87 65 - 99 mg/dL   BUN 8 6 - 20 mg/dL   Creatinine, Ser 0.52 0.44 - 1.00 mg/dL   Calcium 7.2 (L) 8.9 - 10.3 mg/dL   GFR calc non Af Amer >60 >60 mL/min   GFR calc Af Amer >60 >60 mL/min    Comment: (NOTE) The eGFR has been calculated using the CKD EPI equation. This calculation has not been validated in all clinical situations. eGFR's persistently <60 mL/min signify possible Chronic Kidney Disease.    Anion gap 6 5 - 15  Troponin I     Status: None   Collection Time: 05/16/15  5:00 AM  Result Value Ref Range   Troponin I <0.03 <0.031 ng/mL    Comment:        NO INDICATION OF MYOCARDIAL INJURY.   Magnesium     Status: None   Collection Time: 05/16/15  5:00 AM  Result Value Ref Range   Magnesium 1.7 1.7 - 2.4 mg/dL  Phosphorus     Status: Abnormal   Collection Time: 05/16/15  5:00 AM  Result Value Ref Range   Phosphorus 2.3 (L) 2.5 - 4.6 mg/dL  Troponin I     Status: None   Collection Time: 05/16/15  9:50 AM  Result Value Ref Range   Troponin I <0.03 <0.031 ng/mL    Comment:        NO INDICATION OF MYOCARDIAL INJURY.     Current Facility-Administered Medications  Medication Dose Route Frequency Provider Last Rate Last Dose  . antiseptic oral rinse solution (CORINZ)  7 mL Mouth Rinse QID Elsie Stain, MD   7 mL at 05/16/15 1210  . heparin injection 5,000 Units  5,000 Units Subcutaneous 3 times per day Elsie Stain, MD   5,000 Units at 05/16/15  1407  . [START ON 05/17/2015] Influenza vac split quadrivalent PF  (FLUARIX) injection 0.5 mL  0.5 mL Intramuscular Tomorrow-1000 Juanito Doom, MD      . lactated ringers infusion   Intravenous Continuous Erick Colace, NP 50 mL/hr at 05/16/15 1114    . potassium chloride SA (K-DUR,KLOR-CON) CR tablet 40 mEq  40 mEq Oral TID Rush Farmer, MD   40 mEq at 05/16/15 3151    Musculoskeletal: Strength & Muscle Tone: decreased Gait & Station: unable to stand Patient leans: N/A  Psychiatric Specialty Exam: ROS complaints about generalized weakness, drowsiness, short-term memory loss, status post extubated resulted sore throat, blockout after alcohol drinking, anxiety, depression. She denied sweating, shaking, hot flashes, tremors nausea, vomiting, abdominal pain, chest pain and shortness of breath. No Fever-chills, No Headache, No changes with Vision or hearing, reports vertigo No problems swallowing food or Liquids, No Chest pain, Cough or Shortness of Breath, No Abdominal pain, No Nausea or Vommitting, Bowel movements are regular, No Blood in stool or Urine, No dysuria, No new skin rashes or bruises, No new joints pains-aches,  No new weakness, tingling, numbness in any extremity, No recent weight gain or loss, No polyuria, polydypsia or polyphagia,   A full 10 point Review of Systems was done, except as stated above, all other Review of Systems were negative.  Blood pressure 121/67, pulse 108, temperature 100 F (37.8 C), temperature source Core (Comment), resp. rate 17, height 5' 3"  (1.6 m), weight 58.2 kg (128 lb 4.9 oz), last menstrual period 05/01/2015, SpO2 100 %.Body mass index is 22.73 kg/(m^2).  General Appearance: Guarded  Eye Contact::  Fair  Speech:  Clear and Coherent and Slow  Volume:  Decreased  Mood:  Anxious and Depressed  Affect:  Constricted and Depressed  Thought Process:  Coherent and Goal Directed  Orientation:  Full (Time, Place, and Person)  Thought Content:  Rumination  Suicidal Thoughts:  Status post polydrug  overdose and is status post intubation. Unable to remember the incident of intentional overdose.  Homicidal Thoughts:  No  Memory:  Immediate;   Poor Recent;   Fair Remote;   Good  Judgement:  Impaired  Insight:  Shallow  Psychomotor Activity:  Decreased  Concentration:  Fair  Recall:  Blocked out for the incident of unintentional overdoseout  Fund of Knowledge:Good  Language: Good  Akathisia:  Negative  Handed:  Right  AIMS (if indicated):     Assets:  Communication Skills Desire for Improvement Financial Resources/Insurance Housing Intimacy Leisure Time Physical Health Resilience Social Support Talents/Skills Transportation Vocational/Educational  ADL's:  Intact  Cognition: WNL  Sleep:      Treatment Plan Summary: Daily contact with patient to assess and evaluate symptoms and progress in treatment and Medication management  Recommended acute psychiatric hospitalization for crisis stabilization, safety monitoring and medication management.   Disposition: Recommend psychiatric Inpatient admission when medically cleared. Supportive therapy provided about ongoing stressors.  Buna Cuppett,JANARDHAHA R. 05/16/2015 2:15 PM

## 2015-05-16 NOTE — Progress Notes (Signed)
[facility] Progress Note Patient Name: Kristine Burton DOB: February 08, 1976 MRN: [id number]   Date of Service  05/16/2015  HPI/Events of Note  Multiple issues: 1. Hypotension - BP = 76/42, 2. K+ = 3.7 and 3. Poison Control requests AM Mg++ level.   eICU Interventions  Will order: 1. Bolus with 0.9 NaCl 1 liter IV over 1 hour now.  2. Wean Precedex IV infusion as tolerated.  3. Replete K+. 4. Mg++ level in AM.      Intervention Category Major Interventions: Hypotension - evaluation and management Intermediate Interventions: Electrolyte abnormality - evaluation and management  Ludwika Rodd Eugene 05/16/2015, 12:06 AM

## 2015-05-16 NOTE — Care Management Note (Signed)
Case Management Note  Patient Details  Name: Kristine Burton MRN: [id number] Date of Birth: Feb 25, 1976  Subjective/Objective:      overdose              Action/Plan:Date: May 16, 2015 Chart reviewed for concurrent status and case management needs. Will continue to follow patient for changes and needs: Velva Harman, RN, BSN, [place]   [phone]  Expected Discharge Date:                  Expected Discharge Plan:  Home/Self Care  In-House Referral:  Clinical Social Work  Discharge planning Services  CM Consult  Post Acute Care Choice:  NA Choice offered to:  NA  DME Arranged:    DME Agency:     HH Arranged:    [place] Agency:     Status of Service:  In process, will continue to follow  Medicare Important Message Given:    Date Medicare IM Given:    Medicare IM give by:    Date Additional Medicare IM Given:    Additional Medicare Important Message give by:     If discussed at [place] of Stay Meetings, dates discussed:    Additional Comments:  Leeroy Cha, RN 05/16/2015, 10:10 AM

## 2015-05-16 NOTE — Progress Notes (Signed)
PULMONARY / CRITICAL CARE MEDICINE   Name: Kristine Burton MRN: [id number] DOB: 1976-07-12    ADMISSION DATE:  05/15/2015  REFERRING MD :  EDP Ray  CHIEF COMPLAINT:  Overdose lexapro/seroquel  INITIAL PRESENTATION:  39 y.o.F with anxiety hx, ETOH use earlier then took overdose intentionally of 45-50 seroquel and lexapro ? Amount.  Arrived ED sonorous respirs and intubated per EDP. CCM asked to admit  STUDIES:  none  SIGNIFICANT EVENTS: Extubated 10/31  SUBJECTIVE:  Awake, alert, follows commands   VITAL SIGNS: Temp:  [96.6 F (35.9 C)-99.7 F (37.6 C)] 99.5 F (37.5 C) (10/31 1000) Pulse Rate:  [88-155] 134 (10/31 1000) Resp:  [12-39] 20 (10/31 1000) BP: (76-161)/(42-85) 111/62 mmHg (10/31 1000) SpO2:  [99 %-100 %] 100 % (10/31 1000) FiO2 (%):  [30 %-100 %] 30 % (10/31 1000) Weight:  [53.978 kg (119 lb)-58.2 kg (128 lb 4.9 oz)] 58.2 kg (128 lb 4.9 oz) (10/31 0500) HEMODYNAMICS:   VENTILATOR SETTINGS: Vent Mode:  [-] PSV;CPAP FiO2 (%):  [30 %-100 %] 30 % Set Rate:  [16 bmp] 16 bmp Vt Set:  [440 mL] 440 mL PEEP:  [5 cmH20] 5 cmH20 Pressure Support:  [5 cmH20] 5 cmH20 Plateau Pressure:  [10 cmH20-12 cmH20] 11 cmH20 INTAKE / OUTPUT:  Intake/Output Summary (Last 24 hours) at 05/16/15 1042 Last data filed at 05/16/15 1000  Gross per 24 hour  Intake 1988.61 ml  Output   1438 ml  Net 550.61 ml    PHYSICAL EXAMINATION: General: off sedation. Moves all ext. No distress, follows commands Neuro:  Appropriate, moves all ext  HENT, PERRL, EOM-spontaneous and MMM. Orally intubated Cardiovascular:  RRR, Nl S1/S2, -M/R/G. Lungs:  CTA bilaterally. Scattered rhonchi that improve w/ cough.  ETT 6 above carina. Abdomen:  Soft, NT, ND and +BS. Musculoskeletal:  -edema and -tenderness. Skin:  Intact.  LABS:  CBC  Recent Labs Lab 05/15/15 1944 05/16/15 0500  WBC 4.1 6.8  HGB 13.1 11.8*  HCT 38.5 35.5*  PLT 188 179   Coag's No results for input(s): APTT, INR in  the last 168 hours. BMET  Recent Labs Lab 05/15/15 1944 05/16/15 0500  NA 145 141  K 3.7 3.7  CL 113* 113*  CO2 23 22  BUN 10 8  CREATININE 0.59 0.52  GLUCOSE 143* 87   Electrolytes  Recent Labs Lab 05/15/15 1944 05/16/15 0500  CALCIUM 8.0* 7.2*  MG  --  1.7  PHOS  --  2.3*   Sepsis Markers No results for input(s): LATICACIDVEN, PROCALCITON, O2SATVEN in the last 168 hours. ABG  Recent Labs Lab 05/15/15 2020  PHART 7.351  PCO2ART 37.0  PO2ART 528*   Liver Enzymes  Recent Labs Lab 05/15/15 1944  AST 26  ALT 20  ALKPHOS 39  BILITOT 0.5  ALBUMIN 3.9   Cardiac Enzymes  Recent Labs Lab 05/15/15 2339 05/16/15 0500  TROPONINI <0.03 <0.03   Glucose  Recent Labs Lab 05/15/15 1928  GLUCAP 135*   Imaging Dg Chest Port 1 View  05/16/2015  CLINICAL DATA:  Medication overdose, intubated patient EXAM: PORTABLE CHEST 1 VIEW COMPARISON:  Portable chest x-ray of May 15, 2015 FINDINGS: The lungs are well-expanded and clear. The heart and pulmonary vascularity are normal. There is no pleural effusion or pneumothorax. The endotracheal tube tip lies 5.2 cm above the carina. The esophagogastric tube tip projects below the inferior margin of the image. The bony structures are unremarkable. IMPRESSION: Good positioning of the endotracheal tube. There is no active  cardiopulmonary disease. Electronically Signed   By: David  Martinique M.D.   On: 05/16/2015 07:11   Dg Chest Portable 1 View  05/15/2015  CLINICAL DATA:  Intubation.  NG placement.  Drug congestion EXAM: PORTABLE CHEST 1 VIEW COMPARISON:  None. FINDINGS: Endotracheal tube 11 cm above the carina. Recommend advancing endotracheal tube 7 cm for proper placement. NG tube in the proximal stomach.  Side hole near the GE junction. Lungs are clear. Negative for infiltrate effusion or mass. Heart size normal. IMPRESSION: Endotracheal tube 11 cm above the carina, recommend advancing 7 cm Lungs are clear NG tube in the  proximal stomach. Electronically Signed   By: Franchot Gallo M.D.   On: 05/15/2015 20:20   ASSESSMENT / PLAN: Principal Problem:   Overdose of antipsychotic Active Problems:   Overdose of antidepressant   Altered mental status   Acute respiratory failure (HCC)   Polysubstance overdose   QT prolongation  PULMONARY OETT 10/30>> A:Acute respiratory failure d/t polysubstance overdose of ETOH, Lexapro, seroquel > passed SBT P:   Extubate Wean FIO2 Adv diet  Pulse ox  Avoid sedating meds   CARDIOVASCULAR CVL  NONE A: hemodynamically stable, QTc interval prolonged P:  Monitor cardiac status Trend Qtcs x 12 more hours   RENAL A:  Low Mg and borderline K.       Mild hyperchloremia  P:   Change IVFs to LR BMET in AM. Replace electrolytes as indicated.  GASTROINTESTINAL A:  No acute issues P:   NPO-->adv as tol   HEMATOLOGIC A:  No issues P:  Monitor CBC. Transfuse per ICU protocol.  INFECTIOUS A:  No issues P:   Monitor off abx  ENDOCRINE A:  No issues   P:   Monitor  NEUROLOGIC A:  AMS d/t Overdose P:   Dc sedation Sitter to bedside Will call Psych    FAMILY    No distress. Awake and ready for extubation. Will cont tele, IV hydration and qtc monitoring. Will also call psych.   Erick Colace ACNP-BC [place] Pager # [phone] OR # [phone] if no answer   05/16/2015, 10:42 AM   Attending:  I have seen and examined the patient with nurse practitioner/resident and agree with the note above.   On exam this morning Kristine Burton is following commands, lungs clear with vent supported breaths, CV exam with RRR, good cap refill  CXR images personally reviewed> clear lung fields, ETT in place 10/30 EKG> Sinus tach, QTc 562  Intentional anti-psychotic overdose, suicide attempt> QTc this morning normal on telemetry monitoring; she is awake, alert, now without evidence of organ damage from overdose Acute encephalopathy> improved  dramatically, hold sedating medications   Rest as above  My CC time in addition to Dr. Nelda Marseille 45 minutes  Roselie Awkward, MD [facility] [facility] Pager: [phone] Cell: [phone] After 3pm or if no response, call [phone]

## 2015-05-16 NOTE — Procedures (Signed)
Extubation Procedure Note  Patient Details:   Name: Kristine Burton DOB: [birth year] MRN: [id number]    Evaluation  O2 sats: stable throughout Complications: No apparent complications Patient did tolerate procedure well. Bilateral Breath Sounds: Clear Suctioning: Oral, Airway Yes  Irish Elders Royal 05/16/2015, 11:08 AM

## 2015-05-17 ENCOUNTER — Inpatient Hospital Stay (HOSPITAL_COMMUNITY): Admission: AD | Admit: 2015-05-17 | Payer: 59 | Source: Intra-hospital | Admitting: Psychiatry

## 2015-05-17 ENCOUNTER — Encounter (HOSPITAL_COMMUNITY): Payer: Self-pay

## 2015-05-17 ENCOUNTER — Inpatient Hospital Stay (HOSPITAL_COMMUNITY)
Admission: AD | Admit: 2015-05-17 | Discharge: 2015-05-18 | DRG: 897 | Disposition: A | Discharge status: Home / Self Care | Payer: 59 | Source: Intra-hospital | Attending: Psychiatry | Admitting: Psychiatry

## 2015-05-17 DIAGNOSIS — T43222A Poisoning by selective serotonin reuptake inhibitors, intentional self-harm, initial encounter: Secondary | ICD-10-CM | POA: Diagnosis not present

## 2015-05-17 DIAGNOSIS — Y908 Blood alcohol level of 240 mg/100 ml or more: Secondary | ICD-10-CM | POA: Diagnosis present

## 2015-05-17 DIAGNOSIS — T50901A Poisoning by unspecified drugs, medicaments and biological substances, accidental (unintentional), initial encounter: Secondary | ICD-10-CM | POA: Diagnosis not present

## 2015-05-17 DIAGNOSIS — T43592A Poisoning by other antipsychotics and neuroleptics, intentional self-harm, initial encounter: Secondary | ICD-10-CM | POA: Diagnosis not present

## 2015-05-17 DIAGNOSIS — T43201A Poisoning by unspecified antidepressants, accidental (unintentional), initial encounter: Secondary | ICD-10-CM

## 2015-05-17 DIAGNOSIS — F10129 Alcohol abuse with intoxication, unspecified: Secondary | ICD-10-CM

## 2015-05-17 DIAGNOSIS — G9340 Encephalopathy, unspecified: Secondary | ICD-10-CM | POA: Diagnosis not present

## 2015-05-17 DIAGNOSIS — F329 Major depressive disorder, single episode, unspecified: Secondary | ICD-10-CM | POA: Diagnosis present

## 2015-05-17 DIAGNOSIS — G4700 Insomnia, unspecified: Secondary | ICD-10-CM | POA: Diagnosis present

## 2015-05-17 DIAGNOSIS — T1491 Suicide attempt: Secondary | ICD-10-CM | POA: Diagnosis not present

## 2015-05-17 DIAGNOSIS — J9600 Acute respiratory failure, unspecified whether with hypoxia or hypercapnia: Secondary | ICD-10-CM | POA: Diagnosis not present

## 2015-05-17 LAB — CBC
HCT: 38.2 % (ref 36.0–46.0)
Hemoglobin: 12.7 g/dL (ref 12.0–15.0)
MCH: 33.7 pg (ref 26.0–34.0)
MCHC: 33.2 g/dL (ref 30.0–36.0)
MCV: 101.3 fL — ABNORMAL HIGH (ref 78.0–100.0)
PLATELETS: 182 10*3/uL (ref 150–400)
RBC: 3.77 MIL/uL — ABNORMAL LOW (ref 3.87–5.11)
RDW: 12 % (ref 11.5–15.5)
WBC: 12.2 10*3/uL — AB (ref 4.0–10.5)

## 2015-05-17 LAB — BASIC METABOLIC PANEL
ANION GAP: 8 (ref 5–15)
BUN: 6 mg/dL (ref 6–20)
CALCIUM: 8.7 mg/dL — AB (ref 8.9–10.3)
CO2: 27 mmol/L (ref 22–32)
Chloride: 103 mmol/L (ref 101–111)
Creatinine, Ser: 0.58 mg/dL (ref 0.44–1.00)
Glucose, Bld: 108 mg/dL — ABNORMAL HIGH (ref 65–99)
Potassium: 4.5 mmol/L (ref 3.5–5.1)
SODIUM: 138 mmol/L (ref 135–145)

## 2015-05-17 LAB — MAGNESIUM: Magnesium: 2.2 mg/dL (ref 1.7–2.4)

## 2015-05-17 MED ORDER — THIAMINE HCL 100 MG/ML IJ SOLN: 100.0000 mg | Freq: Once | INTRAMUSCULAR | Status: DC

## 2015-05-17 MED ORDER — CHLORDIAZEPOXIDE HCL 25 MG PO CAPS: 25.0000 mg | ORAL_CAPSULE | Freq: Every day | ORAL | Status: DC

## 2015-05-17 MED ORDER — CHLORDIAZEPOXIDE HCL 25 MG PO CAPS
25.0000 mg | ORAL_CAPSULE | Freq: Four times a day (QID) | ORAL | Status: DC
  Filled 2015-05-17: qty 1

## 2015-05-17 MED ORDER — ALUM & MAG HYDROXIDE-SIMETH 200-200-20 MG/5ML PO SUSP: 30.0000 mL | ORAL | Status: DC | PRN

## 2015-05-17 MED ORDER — VITAMIN B-1 100 MG PO TABS
100.0000 mg | ORAL_TABLET | Freq: Every day | ORAL | Status: DC
  Administered 2015-05-18: 100 mg via ORAL
  Filled 2015-05-17 (×3): qty 1

## 2015-05-17 MED ORDER — NORETHIN ACE-ETH ESTRAD-FE 1.5-30 MG-MCG PO TABS
1.0000 | ORAL_TABLET | Freq: Every day | ORAL | Status: DC
  Administered 2015-05-17: 1 via ORAL

## 2015-05-17 MED ORDER — CHLORDIAZEPOXIDE HCL 25 MG PO CAPS: 25.0000 mg | ORAL_CAPSULE | Freq: Four times a day (QID) | ORAL | Status: DC | PRN

## 2015-05-17 MED ORDER — MAGNESIUM HYDROXIDE 400 MG/5ML PO SUSP: 30.0000 mL | Freq: Every day | ORAL | Status: DC | PRN

## 2015-05-17 MED ORDER — TRAZODONE HCL 50 MG PO TABS
50.0000 mg | ORAL_TABLET | Freq: Every evening | ORAL | Status: DC | PRN
  Filled 2015-05-17 (×7): qty 1

## 2015-05-17 MED ORDER — ADULT MULTIVITAMIN W/MINERALS CH
1.0000 | ORAL_TABLET | Freq: Every day | ORAL | Status: DC
  Administered 2015-05-18: 1 via ORAL
  Filled 2015-05-17 (×3): qty 1

## 2015-05-17 MED ORDER — CHLORDIAZEPOXIDE HCL 25 MG PO CAPS: 25.0000 mg | ORAL_CAPSULE | Freq: Three times a day (TID) | ORAL | Status: DC

## 2015-05-17 MED ORDER — HYDROXYZINE HCL 25 MG PO TABS
25.0000 mg | ORAL_TABLET | Freq: Four times a day (QID) | ORAL | Status: DC | PRN
  Administered 2015-05-17: 25 mg via ORAL
  Filled 2015-05-17: qty 1

## 2015-05-17 MED ORDER — ONDANSETRON 4 MG PO TBDP: 4.0000 mg | ORAL_TABLET | Freq: Four times a day (QID) | ORAL | Status: DC | PRN

## 2015-05-17 MED ORDER — NAPROXEN 500 MG PO TABS: 500.0000 mg | ORAL_TABLET | Freq: Two times a day (BID) | ORAL | Status: DC | PRN

## 2015-05-17 MED ORDER — CHLORDIAZEPOXIDE HCL 25 MG PO CAPS: 25.0000 mg | ORAL_CAPSULE | ORAL | Status: DC

## 2015-05-17 MED ORDER — LOPERAMIDE HCL 2 MG PO CAPS: 2.0000 mg | ORAL_CAPSULE | ORAL | Status: DC | PRN

## 2015-05-17 MED ORDER — ACETAMINOPHEN 325 MG PO TABS: 650.0000 mg | ORAL_TABLET | Freq: Four times a day (QID) | ORAL | Status: DC | PRN

## 2015-05-17 NOTE — Progress Notes (Signed)
PULMONARY / CRITICAL CARE MEDICINE   Name: Kristine Burton MRN: [id number] DOB: 1976/04/04    ADMISSION DATE:  05/15/2015  REFERRING MD :  EDP Ray  CHIEF COMPLAINT:  Overdose lexapro/seroquel  INITIAL PRESENTATION:  39 y.o.F with anxiety hx, ETOH use earlier then took overdose intentionally of 45-50 seroquel and lexapro ? Amount.  Arrived ED sonorous respirs and intubated per EDP. CCM asked to admit  STUDIES:  none  SIGNIFICANT EVENTS: Extubated 10/31  SUBJECTIVE:  Awake, alert, follows commands   VITAL SIGNS: Temp:  [98.3 F (36.8 C)-100 F (37.8 C)] 99.9 F (37.7 C) (11/01 0800) Pulse Rate:  [90-134] 90 (11/01 0800) Resp:  [12-20] 13 (11/01 0800) BP: (97-138)/(55-84) 124/74 mmHg (11/01 0800) SpO2:  [97 %-100 %] 97 % (11/01 0800) FiO2 (%):  [30 %] 30 % (10/31 1100) Weight:  [55.9 kg (123 lb 3.8 oz)] 55.9 kg (123 lb 3.8 oz) (11/01 0500) Room air     VENTILATOR SETTINGS: Vent Mode:  [-]  FiO2 (%):  [30 %] 30 % INTAKE / OUTPUT:  Intake/Output Summary (Last 24 hours) at 05/17/15 0858 Last data filed at 05/17/15 0800  Gross per 24 hour  Intake 1295.51 ml  Output   3878 ml  Net -2582.49 ml    PHYSICAL EXAMINATION: General:tearful, but in no distress Neuro:  Appropriate, moves all ext  HENT, PERRL, EOM-spontaneous and MMM. Cardiovascular:  RRR, Nl S1/S2, -M/R/G. Lungs:  Clear Abdomen:  Soft, NT, ND and +BS. Musculoskeletal:  -edema and -tenderness. Skin:  Intact.  LABS:  CBC  Recent Labs Lab 05/15/15 1944 05/16/15 0500 05/17/15 0352  WBC 4.1 6.8 12.2*  HGB 13.1 11.8* 12.7  HCT 38.5 35.5* 38.2  PLT 188 179 182   Coag's No results for input(s): APTT, INR in the last 168 hours. BMET  Recent Labs Lab 05/15/15 1944 05/16/15 0500 05/17/15 0352  NA 145 141 138  K 3.7 3.7 4.5  CL 113* 113* 103  CO2 23 22 27   BUN 10 8 6   CREATININE 0.59 0.52 0.58  GLUCOSE 143* 87 108*   Electrolytes  Recent Labs Lab 05/15/15 1944 05/16/15 0500  05/17/15 0352  CALCIUM 8.0* 7.2* 8.7*  MG  --  1.7 2.2  PHOS  --  2.3*  --    Sepsis Markers No results for input(s): LATICACIDVEN, PROCALCITON, O2SATVEN in the last 168 hours. ABG  Recent Labs Lab 05/15/15 2020  PHART 7.351  PCO2ART 37.0  PO2ART 528*   Liver Enzymes  Recent Labs Lab 05/15/15 1944  AST 26  ALT 20  ALKPHOS 39  BILITOT 0.5  ALBUMIN 3.9   Cardiac Enzymes  Recent Labs Lab 05/15/15 2339 05/16/15 0500 05/16/15 0950  TROPONINI <0.03 <0.03 <0.03   Glucose  Recent Labs Lab 05/15/15 1928  GLUCAP 135*   Imaging No results found. ASSESSMENT / PLAN: Principal Problem:   Polysubstance overdose Active Problems:   Overdose of antidepressant   Overdose of antipsychotic   Altered mental status   Acute respiratory failure (HCC)   QT prolongation   Alcohol abuse with intoxication (HCC)  Polysubstance Overdose: Seroquel, Lexapro and ETOH. Now extubated. Qtc Nml. Hemodynamically clear. She is medically clear for d/c pending final psych rec. (note states would benefit from in-patient counciling but also states does not meet criteria for acute admit). She wants to seek support but does not want to be admitted to the in-patient setting.  Plan Dc IV, tele and foley Regular diet Cont safety sitter for now Will contact Psych;  if no need for in-patient pt would like list of providers as an out-pt (will ask social work to assist) Ready for d/c pending dispo from psych   Mild laryngitis s/p extubation Plan Vocal rest Lemon drops Avoid menthol    Erick Colace ACNP-BC [place] Pager # [phone] OR # [phone] if no answer    05/17/2015, 8:58 AM

## 2015-05-17 NOTE — Consult Note (Signed)
Central Psychiatry Consult   Reason for Consult:  Alcohol intoxication status post intentional overdose of Lexapro and Seroquel Referring Physician:  Dr. Lake Bells Patient Identification: Kristine Burton MRN:  [id number] Principal Diagnosis: Polysubstance overdose Diagnosis:   Patient Active Problem List   Diagnosis Date Noted  . Alcohol abuse with intoxication ([place]) [F10.129] 05/16/2015  . Overdose of antidepressant [T43.201A] 05/15/2015  . Overdose of antipsychotic [T43.501A] 05/15/2015  . Altered mental status [R41.82] 05/15/2015  . Acute respiratory failure ([place]) [J96.00] 05/15/2015  . Polysubstance overdose [T50.901A] 05/15/2015  . QT prolongation [I45.81] 05/15/2015    Total Time spent with patient: 45 minutes  Subjective:   Kristine Burton is a 39 y.o. female patient admitted with confusion, alcohol Intoxication and status post intentional overdose.  HPI: Kristine Burton is a 39 year old female seen, chart reviewed for face-to-face psychiatric consultation and evaluation of depression, anxiety, alcohol intoxication and status post unintentional polydrug (lexapro, seroquel while intoxicated with alcohol) overdose. Reportedly patient contacted one of her friend in a confused/altered mental state and then brought to the [facility] long emergency department. Patient required intubation on arrival to support her respirations. She was extubated couple of hours earlier to his evaluation. Patient complaining about sore throat, difficulty to talk smoothly but denied alcohol withdrawal symptoms. Patient reportedly drinking wine - one bottle a day during the weekend only for more than a year. Patient has been stressed about sharing custody of her younger daughter with her daughter's father every other week and her daughter has been showing behavioral problems like teenage attitude. Patient endorses symptoms of depression, anxious, sleep disturbance and getting black-out after drinking one glass of  wine and also having short-term memory loss regarding the incidents. Patient husband has been in traveling job which makes it more difficult for her to manage home, children and her stresses. Patient blood alcohol Level is 226 on arrival to the emergency department. Urine drug screen is negative for drugs of abuse. Patient is currently on cardiac monitoring and receiving IV fluids. Patient husband stated that he has been working in church for treatment [employer] celebration this weekend and she brought the children to church and husband found her intoxicated with alcohol so he drove her back home and let her lie down in her bed. Several hours later patient husband received a phone call from a common friend saying that she was confused and appeared with altered mental status. He returned home at around 6:30 and found her confused with empty bottles of Lexapro and Seroquel. There were multiple pills missing from each bottles. Reportedly found missing Lexapro which is prescribed to patient and Seroquel which is prescribed to her husband for sleeping. Patient does not recall intentional overdose of medication but reports she has taken 5-10 tabs of each which seems to be minimizing overdose. He denies history of overdose or attempt to harm herself.  Past Psychiatric History: Patient has no history of previous outpatient or inpatient mental health treatment but received outpatient treatment from the primary care physician.   Interval history: patient seen today after noon along with social service and staff RN Theadora Rama. Patient has been emotional and tearful when told about the psych recommendation which is made yesterday and informed to her and her husband. Patient was given education about both process of voluntary and involuntary. After spoke with her husband, she agree to sign in herself voluntarily to [facility]. Spoke with [facility], who found psych bed at  305.2 .   Risk to Self: Is patient  at risk for suicide?:  No Risk to Others:   Prior Inpatient Therapy:   Prior Outpatient Therapy:    Past Medical History:  Past Medical History  Diagnosis Date  . Anxiety    History reviewed. No pertinent past surgical history. Family History: History reviewed. No pertinent family history. Family Psychiatric  History: Pt dad has parkinson disease and brother with alcohol abuse and questionable MS. She has uncle with pancreatic cancer. Social History:  History  Alcohol Use  . Yes     History  Drug Use Not on file    Social History   Social History  . Marital Status: N/A    Spouse Name: N/A  . Number of Children: N/A  . Years of Education: N/A   Social History Main Topics  . Smoking status: Never Smoker   . Smokeless tobacco: None  . Alcohol Use: Yes  . Drug Use: None  . Sexual Activity: Yes   Other Topics Concern  . None   Social History Narrative  . None   Additional Social History:                          Allergies:  No Known Allergies  Labs:  Results for orders placed or performed during the hospital encounter of 05/15/15 (from the past 48 hour(s))  CBG monitoring, ED     Status: Abnormal   Collection Time: 05/15/15  7:28 PM  Result Value Ref Range   Glucose-Capillary 135 (H) 65 - 99 mg/dL  CBC with Differential/Platelet     Status: None   Collection Time: 05/15/15  7:44 PM  Result Value Ref Range   WBC 4.1 4.0 - 10.5 K/uL   RBC 3.91 3.87 - 5.11 MIL/uL   Hemoglobin 13.1 12.0 - 15.0 g/dL   HCT 38.5 36.0 - 46.0 %   MCV 98.5 78.0 - 100.0 fL   MCH 33.5 26.0 - 34.0 pg   MCHC 34.0 30.0 - 36.0 g/dL   RDW 12.0 11.5 - 15.5 %   Platelets 188 150 - 400 K/uL   Neutrophils Relative % 60 %   Neutro Abs 2.5 1.7 - 7.7 K/uL   Lymphocytes Relative 35 %   Lymphs Abs 1.4 0.7 - 4.0 K/uL   Monocytes Relative 4 %   Monocytes Absolute 0.2 0.1 - 1.0 K/uL   Eosinophils Relative 1 %   Eosinophils Absolute 0.0 0.0 - 0.7 K/uL   Basophils Relative 0 %   Basophils Absolute 0.0 0.0 -  0.1 K/uL  Comprehensive metabolic panel     Status: Abnormal   Collection Time: 05/15/15  7:44 PM  Result Value Ref Range   Sodium 145 135 - 145 mmol/L   Potassium 3.7 3.5 - 5.1 mmol/L   Chloride 113 (H) 101 - 111 mmol/L   CO2 23 22 - 32 mmol/L   Glucose, Bld 143 (H) 65 - 99 mg/dL   BUN 10 6 - 20 mg/dL   Creatinine, Ser 0.59 0.44 - 1.00 mg/dL   Calcium 8.0 (L) 8.9 - 10.3 mg/dL   Total Protein 6.6 6.5 - 8.1 g/dL   Albumin 3.9 3.5 - 5.0 g/dL   AST 26 15 - 41 U/L   ALT 20 14 - 54 U/L   Alkaline Phosphatase 39 38 - 126 U/L   Total Bilirubin 0.5 0.3 - 1.2 mg/dL   GFR calc non Af Amer >60 >60 mL/min   GFR calc Af Amer >60 >60 mL/min  Comment: (NOTE) The eGFR has been calculated using the CKD EPI equation. This calculation has not been validated in all clinical situations. eGFR's persistently <60 mL/min signify possible Chronic Kidney Disease.    Anion gap 9 5 - 15  Urine rapid drug screen (hosp performed)     Status: None   Collection Time: 05/15/15  8:00 PM  Result Value Ref Range   Opiates NONE DETECTED NONE DETECTED   Cocaine NONE DETECTED NONE DETECTED   Benzodiazepines NONE DETECTED NONE DETECTED   Amphetamines NONE DETECTED NONE DETECTED   Tetrahydrocannabinol NONE DETECTED NONE DETECTED   Barbiturates NONE DETECTED NONE DETECTED    Comment:        DRUG SCREEN FOR MEDICAL PURPOSES ONLY.  IF CONFIRMATION IS NEEDED FOR ANY PURPOSE, NOTIFY LAB WITHIN 5 DAYS.        LOWEST DETECTABLE LIMITS FOR URINE DRUG SCREEN Drug Class       Cutoff (ng/mL) Amphetamine      1000 Barbiturate      200 Benzodiazepine   102 Tricyclics       585 Opiates          300 Cocaine          300 THC              50   I-Stat Beta hCG blood, ED ([facility], [facility], [facility] only)     Status: None   Collection Time: 05/15/15  8:10 PM  Result Value Ref Range   I-stat hCG, quantitative <5.0 <5 mIU/mL   Comment 3            Comment:   GEST. AGE      CONC.  (mIU/mL)   <=1 WEEK        5 - 50     2 WEEKS       50  - 500     3 WEEKS       100 - 10,000     4 WEEKS     1,000 - 30,000        FEMALE AND NON-PREGNANT FEMALE:     LESS THAN 5 mIU/mL   Ethanol     Status: Abnormal   Collection Time: 05/15/15  8:15 PM  Result Value Ref Range   Alcohol, Ethyl (B) 266 (H) <5 mg/dL    Comment:        LOWEST DETECTABLE LIMIT FOR SERUM ALCOHOL IS 5 mg/dL FOR MEDICAL PURPOSES ONLY   Salicylate level     Status: None   Collection Time: 05/15/15  8:15 PM  Result Value Ref Range   Salicylate Lvl <2.7 2.8 - 30.0 mg/dL  Acetaminophen level     Status: Abnormal   Collection Time: 05/15/15  8:15 PM  Result Value Ref Range   Acetaminophen (Tylenol), Serum <10 (L) 10 - 30 ug/mL    Comment:        THERAPEUTIC CONCENTRATIONS VARY SIGNIFICANTLY. A RANGE OF 10-30 ug/mL MAY BE AN EFFECTIVE CONCENTRATION FOR MANY PATIENTS. HOWEVER, SOME ARE BEST TREATED AT CONCENTRATIONS OUTSIDE THIS RANGE. ACETAMINOPHEN CONCENTRATIONS >150 ug/mL AT 4 HOURS AFTER INGESTION AND >50 ug/mL AT 12 HOURS AFTER INGESTION ARE OFTEN ASSOCIATED WITH TOXIC REACTIONS.   Blood gas, arterial     Status: Abnormal   Collection Time: 05/15/15  8:20 PM  Result Value Ref Range   FIO2 1.00    Delivery systems VENTILATOR    Mode PRESSURE REGULATED VOLUME CONTROL    VT 440 mL   LHR  16 resp/min   Peep/cpap 5.0 cm H20   pH, Arterial 7.351 7.350 - 7.450   pCO2 arterial 37.0 35.0 - 45.0 mmHg   pO2, Arterial 528 (H) 80.0 - 100.0 mmHg   Bicarbonate 20.1 20.0 - 24.0 mEq/L   TCO2 18.3 0 - 100 mmol/L   Acid-base deficit 4.6 (H) 0.0 - 2.0 mmol/L   O2 Saturation 99.7 %   Patient temperature 97.3    Collection site BRACHIAL ARTERY    Sample type ARTERIAL DRAW   MRSA PCR Screening     Status: None   Collection Time: 05/15/15  9:27 PM  Result Value Ref Range   MRSA by PCR NEGATIVE NEGATIVE    Comment:        The GeneXpert MRSA Assay (FDA approved for NASAL specimens only), is one component of a comprehensive MRSA colonization surveillance  program. It is not intended to diagnose MRSA infection nor to guide or monitor treatment for MRSA infections.   Troponin I     Status: None   Collection Time: 05/15/15 11:39 PM  Result Value Ref Range   Troponin I <0.03 <0.031 ng/mL    Comment:        NO INDICATION OF MYOCARDIAL INJURY.   CBC     Status: Abnormal   Collection Time: 05/16/15  5:00 AM  Result Value Ref Range   WBC 6.8 4.0 - 10.5 K/uL   RBC 3.57 (L) 3.87 - 5.11 MIL/uL   Hemoglobin 11.8 (L) 12.0 - 15.0 g/dL   HCT 35.5 (L) 36.0 - 46.0 %   MCV 99.4 78.0 - 100.0 fL   MCH 33.1 26.0 - 34.0 pg   MCHC 33.2 30.0 - 36.0 g/dL   RDW 12.1 11.5 - 15.5 %   Platelets 179 150 - 400 K/uL  Basic metabolic panel     Status: Abnormal   Collection Time: 05/16/15  5:00 AM  Result Value Ref Range   Sodium 141 135 - 145 mmol/L   Potassium 3.7 3.5 - 5.1 mmol/L   Chloride 113 (H) 101 - 111 mmol/L   CO2 22 22 - 32 mmol/L   Glucose, Bld 87 65 - 99 mg/dL   BUN 8 6 - 20 mg/dL   Creatinine, Ser 0.52 0.44 - 1.00 mg/dL   Calcium 7.2 (L) 8.9 - 10.3 mg/dL   GFR calc non Af Amer >60 >60 mL/min   GFR calc Af Amer >60 >60 mL/min    Comment: (NOTE) The eGFR has been calculated using the CKD EPI equation. This calculation has not been validated in all clinical situations. eGFR's persistently <60 mL/min signify possible Chronic Kidney Disease.    Anion gap 6 5 - 15  Troponin I     Status: None   Collection Time: 05/16/15  5:00 AM  Result Value Ref Range   Troponin I <0.03 <0.031 ng/mL    Comment:        NO INDICATION OF MYOCARDIAL INJURY.   Magnesium     Status: None   Collection Time: 05/16/15  5:00 AM  Result Value Ref Range   Magnesium 1.7 1.7 - 2.4 mg/dL  Phosphorus     Status: Abnormal   Collection Time: 05/16/15  5:00 AM  Result Value Ref Range   Phosphorus 2.3 (L) 2.5 - 4.6 mg/dL  Troponin I     Status: None   Collection Time: 05/16/15  9:50 AM  Result Value Ref Range   Troponin I <0.03 <0.031 ng/mL    Comment:  NO  INDICATION OF MYOCARDIAL INJURY.   CBC     Status: Abnormal   Collection Time: 05/17/15  3:52 AM  Result Value Ref Range   WBC 12.2 (H) 4.0 - 10.5 K/uL   RBC 3.77 (L) 3.87 - 5.11 MIL/uL   Hemoglobin 12.7 12.0 - 15.0 g/dL   HCT 38.2 36.0 - 46.0 %   MCV 101.3 (H) 78.0 - 100.0 fL   MCH 33.7 26.0 - 34.0 pg   MCHC 33.2 30.0 - 36.0 g/dL   RDW 12.0 11.5 - 15.5 %   Platelets 182 150 - 400 K/uL  Basic metabolic panel     Status: Abnormal   Collection Time: 05/17/15  3:52 AM  Result Value Ref Range   Sodium 138 135 - 145 mmol/L   Potassium 4.5 3.5 - 5.1 mmol/L    Comment: DELTA CHECK NOTED REPEATED TO VERIFY NO VISIBLE HEMOLYSIS    Chloride 103 101 - 111 mmol/L   CO2 27 22 - 32 mmol/L   Glucose, Bld 108 (H) 65 - 99 mg/dL   BUN 6 6 - 20 mg/dL   Creatinine, Ser 0.58 0.44 - 1.00 mg/dL   Calcium 8.7 (L) 8.9 - 10.3 mg/dL   GFR calc non Af Amer >60 >60 mL/min   GFR calc Af Amer >60 >60 mL/min    Comment: (NOTE) The eGFR has been calculated using the CKD EPI equation. This calculation has not been validated in all clinical situations. eGFR's persistently <60 mL/min signify possible Chronic Kidney Disease.    Anion gap 8 5 - 15  Magnesium     Status: None   Collection Time: 05/17/15  3:52 AM  Result Value Ref Range   Magnesium 2.2 1.7 - 2.4 mg/dL    Current Facility-Administered Medications  Medication Dose Route Frequency Provider Last Rate Last Dose  . heparin injection 5,000 Units  5,000 Units Subcutaneous 3 times per day Elsie Stain, MD   5,000 Units at 05/17/15 0511    Musculoskeletal: Strength & Muscle Tone: decreased Gait & Station: unable to stand Patient leans: N/A  Psychiatric Specialty Exam: ROS   Blood pressure 128/90, pulse 101, temperature 98.6 F (37 C), temperature source Oral, resp. rate 14, height 5' 3"  (1.6 m), weight 55.9 kg (123 lb 3.8 oz), last menstrual period 05/01/2015, SpO2 100 %.Body mass index is 21.84 kg/(m^2).  General Appearance: Guarded   Eye Contact::  Fair  Speech:  Clear and Coherent and Slow  Volume:  Decreased  Mood:  Anxious and Depressed  Affect:  Constricted and Depressed, tearful.   Thought Process:  Coherent and Goal Directed  Orientation:  Full (Time, Place, and Person)  Thought Content:  Rumination  Suicidal Thoughts:  Status post polydrug overdose and is status post intubation. Unable to remember the incident of intentional overdose.  Homicidal Thoughts:  No  Memory:  Immediate;   Poor Recent;   Fair Remote;   Good  Judgement:  Impaired  Insight:  Shallow  Psychomotor Activity:  Decreased  Concentration:  Fair  Recall:  Blocked out for the incident of unintentional overdoseout  Fund of Knowledge:Good  Language: Good  Akathisia:  Negative  Handed:  Right  AIMS (if indicated):     Assets:  Communication Skills Desire for Improvement Financial Resources/Insurance Housing Intimacy Leisure Time Physical Health Resilience Social Support Talents/Skills Transportation Vocational/Educational  ADL's:  Intact  Cognition: WNL  Sleep:      Treatment Plan Summary: Daily contact with patient to assess and evaluate  symptoms and progress in treatment and Medication management  Admit to acute psychiatric hospitalization at [facility] for crisis stabilization, safety monitoring and medication management.  Appreciate psych social service and [facility] for the smooth process of admission Patient is medically stable and discharged for transfer.   Disposition: Recommend psychiatric Inpatient admission when medically cleared. Supportive therapy provided about ongoing stressors.  Sadira Standard,JANARDHAHA R. 05/17/2015 3:11 PM

## 2015-05-17 NOTE — Progress Notes (Signed)
Pt discharged to [facility] via [employer] after given discharge instructions and discontinuing IV.

## 2015-05-17 NOTE — Progress Notes (Signed)
This is 39 yrs old female came to [facility] from [facility]. Pt has been tearful through the admission, pt stated she feel like a prisoner and she is not crazy, pt also stated that she brought in on herself. Pt is worried about her 22 yrs old daughter who is currently stay with the father, pt thinks that she is not having enough time the daughter even they have shared custody. Pt denied physical, has a sore throat post intubation, denied SI, HI and contracted for safety. Pt introduced to the unit, although continues to cry. Safety maintained, we will continue to monitor.

## 2015-05-17 NOTE — Progress Notes (Signed)
Pt believes she is being discharged home.  Contacted Dr Arbutus Ped, Jonnalagadda to reinforce discharge plans to [facility].

## 2015-05-17 NOTE — Progress Notes (Signed)
Awaiting bedside visit from Dr. Arbutus Ped.  Pt's husband request availability of Physician at this present time to discuss discharge details.  E-link contacted to counsel patient.
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
## 2015-05-17 NOTE — Discharge Summary (Signed)
Physician Discharge Summary       Patient ID: Kristine Burton MRN: [id number] DOB/AGE: 39-Jan-1977 39 y.o.  Admit date: 05/15/2015 Discharge date: 05/17/2015  Discharge Diagnoses:   Polysubstance overdose  Acute encephalopathy  Acute respiratory failure  Qt Prolongation  Hyperchloremia  Laryngitis  Detailed Hospital Course:  39 year old female w/ only history recorded as depression/anxiety for which her PCP has started her on Lexapro for. She does have a h/o ETOH and binge drinking on weekend. On 10/30 had been drinking heavily and then took several Seroquel and Lexapro. Initially it was reported as 45-50 seroquel and an undefined amount of lexapro. Post-extubation she claimed she dumped several of each pill down the toilet before she called her husband and notified a friend of the overdose. She became progressively somnolent en route to the ED  W/ intermittent episodes of apnea. She was intubated for airway protection. Initial Qtc was 0.56. Poison control was contracted. She was treated w/ IV hydration, Magnesium sulfate, and ventilatory support w/ Serial checks of her Qtc. Her Qtc normalized. She was stable and awake the am of 10/31 and was extubated. She did have some mild hyperchloremia from IVF resuscitation on 10/31 and for this we changed her IVFs to LR which corrected this abnormality. She was seen by psych and felt that she would benefit from in-patient care. Her foley catheter and IV were removed on 11/1 and she is medically cleared for d/c to in-patient psych for crisis intervention.    Discharge Plan by active problems  Polysubstance Overdose: Seroquel, Lexapro and ETOH. Now extubated. Qtc Nml. Hemodynamically clear. She is medically clear for d/c pending final psych rec. (note states would benefit from in-patient counciling but also states does not meet criteria for acute admit). She wants to seek support but does not want to be admitted to the in-patient setting.   Plan In-patient psych for crisis management   Mild laryngitis s/p extubation Plan Vocal rest Lemon drops Avoid menthol     Significant Hospital tests/ studies  Consults:psych   Discharge Exam: BP 124/74 mmHg  Pulse 90  Temp(Src) 99.9 F (37.7 C) (Core (Comment))  Resp 13  Ht 5' 3"  (1.6 m)  Wt 55.9 kg (123 lb 3.8 oz)  BMI 21.84 kg/m2  SpO2 97%  LMP 05/01/2015 (Approximate)  General:tearful, but in no distress Neuro: Appropriate, moves all ext  HENT, PERRL, EOM-spontaneous and MMM. Cardiovascular: RRR, Nl S1/S2, -M/R/G. Lungs: Clear Abdomen: Soft, NT, ND and +BS. Musculoskeletal: -edema and -tenderness. Skin: Intact.  Labs at discharge Lab Results  Component Value Date   CREATININE 0.58 05/17/2015   BUN 6 05/17/2015   NA 138 05/17/2015   K 4.5 05/17/2015   CL 103 05/17/2015   CO2 27 05/17/2015   Lab Results  Component Value Date   WBC 12.2* 05/17/2015   HGB 12.7 05/17/2015   HCT 38.2 05/17/2015   MCV 101.3* 05/17/2015   PLT 182 05/17/2015   Lab Results  Component Value Date   ALT 20 05/15/2015   AST 26 05/15/2015   ALKPHOS 39 05/15/2015   BILITOT 0.5 05/15/2015   No results found for: INR, PROTIME  Current radiology studies Dg Chest Port 1 View  05/16/2015  CLINICAL DATA:  Medication overdose, intubated patient EXAM: PORTABLE CHEST 1 VIEW COMPARISON:  Portable chest x-ray of May 15, 2015 FINDINGS: The lungs are well-expanded and clear. The heart and pulmonary vascularity are normal. There is no pleural effusion or pneumothorax. The endotracheal tube tip lies 5.2  cm above the carina. The esophagogastric tube tip projects below the inferior margin of the image. The bony structures are unremarkable. IMPRESSION: Good positioning of the endotracheal tube. There is no active cardiopulmonary disease. Electronically Signed   By: David  Martinique M.D.   On: 05/16/2015 07:11   Dg Chest Portable 1 View  05/15/2015  CLINICAL DATA:  Intubation.  NG  placement.  Drug congestion EXAM: PORTABLE CHEST 1 VIEW COMPARISON:  None. FINDINGS: Endotracheal tube 11 cm above the carina. Recommend advancing endotracheal tube 7 cm for proper placement. NG tube in the proximal stomach.  Side hole near the GE junction. Lungs are clear. Negative for infiltrate effusion or mass. Heart size normal. IMPRESSION: Endotracheal tube 11 cm above the carina, recommend advancing 7 cm Lungs are clear NG tube in the proximal stomach. Electronically Signed   By: Franchot Gallo M.D.   On: 05/15/2015 20:20    Disposition:  Final discharge disposition not confirmed      Discharge Instructions    Diet - low sodium heart healthy    Complete by:  As directed      Increase activity slowly    Complete by:  As directed             Medication List    STOP taking these medications        escitalopram 20 MG tablet-->will defer this to psych re: resuming or trial new agent.   Commonly known as:  LEXAPRO     PRESCRIPTION MEDICATION         Discharged Condition: good  Physician Statement:   The Patient was personally examined, the discharge assessment and plan has been personally reviewed and I agree with ACNP Anglea Gordner's assessment and plan. > 30 minutes of time have been dedicated to discharge assessment, planning and discharge instructions.   Signed: Makinlee Awwad,PETE 05/17/2015, 11:50 AM

## 2015-05-17 NOTE — Progress Notes (Signed)
[facility] Notes:  (Nursing/MHT/Case Management/Adjunct)  Date:  05/17/2015  Time:  9:31 PM  Type of Therapy:  Psychoeducational Skills  Participation Level:  Active  Participation Quality:  Appropriate, Attentive and Sharing  Affect:  Appropriate and Flat and Tearful  Cognitive:  Appropriate  Insight:  Appropriate and Good  Engagement in Group:  Engaged  Modes of Intervention:  Discussion  Summary of Progress/Problems: Pt rated her day a 2/10 because she was not allowed to leave the hospital and was made to come here. Pt stated her goal is to get back to her family. One positive thing today, pt stated being here in group, this is a step in the right direction.  Clint Bolder 05/17/2015, 9:31 PM

## 2015-05-17 NOTE — Progress Notes (Signed)
Contacted pt's husband, Gerald Stabs, to make him aware of pt's transfer to [facility].

## 2015-05-18 DIAGNOSIS — F10129 Alcohol abuse with intoxication, unspecified: Principal | ICD-10-CM

## 2015-05-18 MED ORDER — HYDROXYZINE HCL 25 MG PO TABS: 25.0000 mg | ORAL_TABLET | Freq: Four times a day (QID) | ORAL | Status: DC | PRN

## 2015-05-18 MED ORDER — ESCITALOPRAM OXALATE 10 MG PO TABS: 10.0000 mg | ORAL_TABLET | Freq: Every day | ORAL | Status: DC

## 2015-05-18 MED ORDER — ESCITALOPRAM OXALATE 10 MG PO TABS
10.0000 mg | ORAL_TABLET | Freq: Every day | ORAL | Status: DC
  Filled 2015-05-18 (×2): qty 1
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
## 2015-05-18 NOTE — Tx Team (Signed)
Initial Interdisciplinary Treatment Plan   PATIENT STRESSORS: Health problems Marital or family conflict Substance abuse   PATIENT STRENGTHS: Capable of independent living Communication skills General fund of knowledge Supportive family/friends   PROBLEM LIST: Problem List/Patient Goals Date to be addressed Date deferred Reason deferred Estimated date of resolution  "Get to the bottom of my anxiety" 05/17/2015     "Learn coping skills to stop drinking." 05/17/2015     Suicide Risk 05/17/2015     Depression 05/17/2015                                    DISCHARGE CRITERIA:  Ability to meet basic life and health needs Adequate post-discharge living arrangements Improved stabilization in mood, thinking, and/or behavior Medical problems require only outpatient monitoring Safe-care adequate arrangements made Verbal commitment to aftercare and medication compliance Withdrawal symptoms are absent or subacute and managed without 24-hour nursing intervention  PRELIMINARY DISCHARGE PLAN: Attend aftercare/continuing care group Outpatient therapy Participate in family therapy Return to previous living arrangement  PATIENT/FAMIILY INVOLVEMENT: This treatment plan has been presented to and reviewed with the patient, Kristine Burton.  The patient and family have been given the opportunity to ask questions and make suggestions.  Pricilla Riffle M

## 2015-05-18 NOTE — [facility] Suicide Risk Assessment (Signed)
[place] INPATIENT:  Family/Significant Other Suicide Prevention Education  Suicide Prevention Education:  Education Completed; Seymone Forlenza (pt's husband) [phone] has been identified by the patient as the family member/significant other with whom the patient will be residing, and identified as the person(s) who will aid the patient in the event of a mental health crisis (suicidal ideations/suicide attempt).  With written consent from the patient, the family member/significant other has been provided the following suicide prevention education, prior to the and/or following the discharge of the patient.  The suicide prevention education provided includes the following:  Suicide risk factors  Suicide prevention and interventions  National Suicide Hotline telephone number  [facility] assessment telephone number  [facility] Emergency Assistance [place] and/or Residential Mobile Crisis Unit telephone number  Request made of family/significant other to:  Remove weapons (e.g., guns, rifles, knives), all items previously/currently identified as safety concern.    Remove drugs/medications (over-the-counter, prescriptions, illicit drugs), all items previously/currently identified as a safety concern.  The family member/significant other verbalizes understanding of the suicide prevention education information provided.  The family member/significant other agrees to remove the items of safety concern listed above.  Smart, Yukio Bisping LCSWA 05/18/2015, 9:47 AM

## 2015-05-18 NOTE — Discharge Summary (Signed)
Physician Discharge Summary Note  Patient:  Kristine Burton is an 39 y.o., female MRN:  [id number] DOB:  Dec 17, 1975 Patient phone:  [phone] (home)  Patient address:   [place] [address],  Total Time spent with patient: 45 minutes  Date of Admission:  05/17/2015 Date of Discharge: 05/18/2015  Reason for Admission:   Pt is presently emergently, report of drug ingestion with unknown intension, drugs ingested include Lexapro (uknown amount) and Seroquel(provale 40tablets), and a glass of wine. All theses is reported 2 hours PTA. GCS of less than 13 on arrival, on non-rebreather to maintain airway per medics. Poison control being contacted.   Principal Problem: Alcohol abuse with intoxication [facility]) Discharge Diagnoses: Patient Active Problem List   Diagnosis Date Noted  . Alcohol abuse with intoxication ([place]) [F10.129] 05/16/2015    Priority: High  . Overdose of antidepressant [T43.201A] 05/15/2015  . Overdose of antipsychotic [T43.501A] 05/15/2015  . Altered mental status [R41.82] 05/15/2015  . Acute respiratory failure ([place]) [J96.00] 05/15/2015  . Polysubstance overdose [T50.901A] 05/15/2015  . QT prolongation [I45.81] 05/15/2015    Musculoskeletal: Strength & Muscle Tone: within normal limits Gait & Station: normal Patient leans: N/A  Psychiatric Specialty Exam: Physical Exam  Review of Systems  Psychiatric/Behavioral: Positive for depression. Negative for suicidal ideas, hallucinations and substance abuse. The patient is nervous/anxious and has insomnia.   All other systems reviewed and are negative.   Blood pressure 116/81, pulse 126, temperature 98.2 F (36.8 C), temperature source Oral, resp. rate 16, height 5' 6"  (1.676 m), weight 54.432 kg (120 lb), last menstrual period 05/01/2015, SpO2 97 %.Body mass index is 19.38 kg/(m^2).  SEE MD PSE within the SRA   Have you used any form of tobacco in the last 30 days? (Cigarettes, Smokeless Tobacco,  Cigars, and/or Pipes): No  Has this patient used any form of tobacco in the last 30 days? (Cigarettes, Smokeless Tobacco, Cigars, and/or Pipes) No  Past Medical History:  Past Medical History  Diagnosis Date  . Anxiety    History reviewed. No pertinent past surgical history. Family History: History reviewed. No pertinent family history. Social History:  History  Alcohol Use  . Yes     History  Drug Use Not on file    Social History   Social History  . Marital Status: Married    Spouse Name: N/A  . Number of Children: N/A  . Years of Education: N/A   Social History Main Topics  . Smoking status: Never Smoker   . Smokeless tobacco: None  . Alcohol Use: Yes  . Drug Use: None  . Sexual Activity: Yes   Other Topics Concern  . None   Social History Narrative    Risk to Self: Is patient at risk for suicide?: Yes Risk to Others:   Prior Inpatient Therapy:   Prior Outpatient Therapy:    Level of Care:  OP  Hospital Course:   Kristine Burton was admitted for Alcohol abuse with intoxication [facility]) , with psychosis and crisis management.  Pt was treated discharged with the medications listed below under Medication List.  Medical problems were identified and treated as needed.  Home medications were restarted as appropriate. Improvement was monitored by observation and Kristine Burton 's daily report of symptom reduction.  Emotional and mental status was monitored by daily self-inventory reports completed by Kristine Burton and clinical staff.        Kristine Burton was evaluated by the treatment team for stability and plans for continued  recovery upon discharge. Kristine Burton 's motivation was an integral factor for scheduling further treatment. Employment, transportation, bed availability, health status, family support, and any pending legal issues were also considered during hospital stay. Pt was offered further treatment options upon discharge including but not  limited to Residential, Intensive Outpatient, and Outpatient treatment.  Kristine Burton will follow up with the services as listed below under Follow Up Information.     Upon completion of this admission the patient was both mentally and medically stable for discharge denying suicidal/homicidal ideation, auditory/visual/tactile hallucinations, delusional thoughts and paranoia.   Pt spent 3 full days under psychiatric care as the psychiatric consult service did consult on the pt while in the other hospital, then she came here and was evaluated daily as well. During the 72 hours of hospital care, pt has improved and is denying suicidal ideation and does not objectively present with gestures as confirmed by multiple providers and support staff members. Based on her initial BAL of 266 and her extremely intoxicated state, it was determined that pt was not fully aware of her actions leading up tot he time of her presentation to the hospital. Taking into account all of these factors, pt was deemed appropriate for discharge and follow-up therapy/psychiatry by Dr. Sabra Heck, see MD Suicide Risk Assessment for more details.   Consults:  None  Significant Diagnostic Studies:  BAL 266, UDS negative  Discharge Vitals:   Blood pressure 116/81, pulse 126, temperature 98.2 F (36.8 C), temperature source Oral, resp. rate 16, height _0  (1.676 m), weight 54.432 kg (120 lb), last menstrual period 05/01/2015, SpO2 97 %. Body mass index is 19.38 kg/(m^2). Lab Results:   Results for orders placed or performed during the hospital encounter of 05/15/15 (from the past 72 hour(s))  CBG monitoring, ED     Status: Abnormal   Collection Time: 05/15/15  7:28 PM  Result Value Ref Range   Glucose-Capillary 135 (H) 65 - 99 mg/dL  CBC with Differential/Platelet     Status: None   Collection Time: 05/15/15  7:44 PM  Result Value Ref Range   WBC 4.1 4.0 - 10.5 K/uL   RBC 3.91 3.87 - 5.11 MIL/uL   Hemoglobin 13.1 12.0 - 15.0  g/dL   HCT 38.5 36.0 - 46.0 %   MCV 98.5 78.0 - 100.0 fL   MCH 33.5 26.0 - 34.0 pg   MCHC 34.0 30.0 - 36.0 g/dL   RDW 12.0 11.5 - 15.5 %   Platelets 188 150 - 400 K/uL   Neutrophils Relative % 60 %   Neutro Abs 2.5 1.7 - 7.7 K/uL   Lymphocytes Relative 35 %   Lymphs Abs 1.4 0.7 - 4.0 K/uL   Monocytes Relative 4 %   Monocytes Absolute 0.2 0.1 - 1.0 K/uL   Eosinophils Relative 1 %   Eosinophils Absolute 0.0 0.0 - 0.7 K/uL   Basophils Relative 0 %   Basophils Absolute 0.0 0.0 - 0.1 K/uL  Comprehensive metabolic panel     Status: Abnormal   Collection Time: 05/15/15  7:44 PM  Result Value Ref Range   Sodium 145 135 - 145 mmol/L   Potassium 3.7 3.5 - 5.1 mmol/L   Chloride 113 (H) 101 - 111 mmol/L   CO2 23 22 - 32 mmol/L   Glucose, Bld 143 (H) 65 - 99 mg/dL   BUN 10 6 - 20 mg/dL   Creatinine, Ser 0.59 0.44 - 1.00 mg/dL   Calcium 8.0 (L) 8.9 - 10.3  mg/dL   Total Protein 6.6 6.5 - 8.1 g/dL   Albumin 3.9 3.5 - 5.0 g/dL   AST 26 15 - 41 U/L   ALT 20 14 - 54 U/L   Alkaline Phosphatase 39 38 - 126 U/L   Total Bilirubin 0.5 0.3 - 1.2 mg/dL   GFR calc non Af Amer >60 >60 mL/min   GFR calc Af Amer >60 >60 mL/min    Comment: (NOTE) The eGFR has been calculated using the CKD EPI equation. This calculation has not been validated in all clinical situations. eGFR's persistently <60 mL/min signify possible Chronic Kidney Disease.    Anion gap 9 5 - 15  Urine rapid drug screen (hosp performed)     Status: None   Collection Time: 05/15/15  8:00 PM  Result Value Ref Range   Opiates NONE DETECTED NONE DETECTED   Cocaine NONE DETECTED NONE DETECTED   Benzodiazepines NONE DETECTED NONE DETECTED   Amphetamines NONE DETECTED NONE DETECTED   Tetrahydrocannabinol NONE DETECTED NONE DETECTED   Barbiturates NONE DETECTED NONE DETECTED    Comment:        DRUG SCREEN FOR MEDICAL PURPOSES ONLY.  IF CONFIRMATION IS NEEDED FOR ANY PURPOSE, NOTIFY LAB WITHIN 5 DAYS.        LOWEST DETECTABLE  LIMITS FOR URINE DRUG SCREEN Drug Class       Cutoff (ng/mL) Amphetamine      1000 Barbiturate      200 Benzodiazepine   798 Tricyclics       921 Opiates          300 Cocaine          300 THC              50   I-Stat Beta hCG blood, ED ([facility], [facility], [facility] only)     Status: None   Collection Time: 05/15/15  8:10 PM  Result Value Ref Range   I-stat hCG, quantitative <5.0 <5 mIU/mL   Comment 3            Comment:   GEST. AGE      CONC.  (mIU/mL)   <=1 WEEK        5 - 50     2 WEEKS       50 - 500     3 WEEKS       100 - 10,000     4 WEEKS     1,000 - 30,000        FEMALE AND NON-PREGNANT FEMALE:     LESS THAN 5 mIU/mL   Ethanol     Status: Abnormal   Collection Time: 05/15/15  8:15 PM  Result Value Ref Range   Alcohol, Ethyl (B) 266 (H) <5 mg/dL    Comment:        LOWEST DETECTABLE LIMIT FOR SERUM ALCOHOL IS 5 mg/dL FOR MEDICAL PURPOSES ONLY   Salicylate level     Status: None   Collection Time: 05/15/15  8:15 PM  Result Value Ref Range   Salicylate Lvl <1.9 2.8 - 30.0 mg/dL  Acetaminophen level     Status: Abnormal   Collection Time: 05/15/15  8:15 PM  Result Value Ref Range   Acetaminophen (Tylenol), Serum <10 (L) 10 - 30 ug/mL    Comment:        THERAPEUTIC CONCENTRATIONS VARY SIGNIFICANTLY. A RANGE OF 10-30 ug/mL MAY BE AN EFFECTIVE CONCENTRATION FOR MANY PATIENTS. HOWEVER, SOME ARE BEST TREATED AT CONCENTRATIONS OUTSIDE THIS RANGE.  ACETAMINOPHEN CONCENTRATIONS >150 ug/mL AT 4 HOURS AFTER INGESTION AND >50 ug/mL AT 12 HOURS AFTER INGESTION ARE OFTEN ASSOCIATED WITH TOXIC REACTIONS.   Blood gas, arterial     Status: Abnormal   Collection Time: 05/15/15  8:20 PM  Result Value Ref Range   FIO2 1.00    Delivery systems VENTILATOR    Mode PRESSURE REGULATED VOLUME CONTROL    VT 440 mL   LHR 16 resp/min   Peep/cpap 5.0 cm H20   pH, Arterial 7.351 7.350 - 7.450   pCO2 arterial 37.0 35.0 - 45.0 mmHg   pO2, Arterial 528 (H) 80.0 - 100.0 mmHg   Bicarbonate 20.1 20.0  - 24.0 mEq/L   TCO2 18.3 0 - 100 mmol/L   Acid-base deficit 4.6 (H) 0.0 - 2.0 mmol/L   O2 Saturation 99.7 %   Patient temperature 97.3    Collection site BRACHIAL ARTERY    Sample type ARTERIAL DRAW   MRSA PCR Screening     Status: None   Collection Time: 05/15/15  9:27 PM  Result Value Ref Range   MRSA by PCR NEGATIVE NEGATIVE    Comment:        The GeneXpert MRSA Assay (FDA approved for NASAL specimens only), is one component of a comprehensive MRSA colonization surveillance program. It is not intended to diagnose MRSA infection nor to guide or monitor treatment for MRSA infections.   Troponin I     Status: None   Collection Time: 05/15/15 11:39 PM  Result Value Ref Range   Troponin I <0.03 <0.031 ng/mL    Comment:        NO INDICATION OF MYOCARDIAL INJURY.   CBC     Status: Abnormal   Collection Time: 05/16/15  5:00 AM  Result Value Ref Range   WBC 6.8 4.0 - 10.5 K/uL   RBC 3.57 (L) 3.87 - 5.11 MIL/uL   Hemoglobin 11.8 (L) 12.0 - 15.0 g/dL   HCT 35.5 (L) 36.0 - 46.0 %   MCV 99.4 78.0 - 100.0 fL   MCH 33.1 26.0 - 34.0 pg   MCHC 33.2 30.0 - 36.0 g/dL   RDW 12.1 11.5 - 15.5 %   Platelets 179 150 - 400 K/uL  Basic metabolic panel     Status: Abnormal   Collection Time: 05/16/15  5:00 AM  Result Value Ref Range   Sodium 141 135 - 145 mmol/L   Potassium 3.7 3.5 - 5.1 mmol/L   Chloride 113 (H) 101 - 111 mmol/L   CO2 22 22 - 32 mmol/L   Glucose, Bld 87 65 - 99 mg/dL   BUN 8 6 - 20 mg/dL   Creatinine, Ser 0.52 0.44 - 1.00 mg/dL   Calcium 7.2 (L) 8.9 - 10.3 mg/dL   GFR calc non Af Amer >60 >60 mL/min   GFR calc Af Amer >60 >60 mL/min    Comment: (NOTE) The eGFR has been calculated using the CKD EPI equation. This calculation has not been validated in all clinical situations. eGFR's persistently <60 mL/min signify possible Chronic Kidney Disease.    Anion gap 6 5 - 15  Troponin I     Status: None   Collection Time: 05/16/15  5:00 AM  Result Value Ref Range    Troponin I <0.03 <0.031 ng/mL    Comment:        NO INDICATION OF MYOCARDIAL INJURY.   Magnesium     Status: None   Collection Time: 05/16/15  5:00 AM  Result  Value Ref Range   Magnesium 1.7 1.7 - 2.4 mg/dL  Phosphorus     Status: Abnormal   Collection Time: 05/16/15  5:00 AM  Result Value Ref Range   Phosphorus 2.3 (L) 2.5 - 4.6 mg/dL  Troponin I     Status: None   Collection Time: 05/16/15  9:50 AM  Result Value Ref Range   Troponin I <0.03 <0.031 ng/mL    Comment:        NO INDICATION OF MYOCARDIAL INJURY.   CBC     Status: Abnormal   Collection Time: 05/17/15  3:52 AM  Result Value Ref Range   WBC 12.2 (H) 4.0 - 10.5 K/uL   RBC 3.77 (L) 3.87 - 5.11 MIL/uL   Hemoglobin 12.7 12.0 - 15.0 g/dL   HCT 38.2 36.0 - 46.0 %   MCV 101.3 (H) 78.0 - 100.0 fL   MCH 33.7 26.0 - 34.0 pg   MCHC 33.2 30.0 - 36.0 g/dL   RDW 12.0 11.5 - 15.5 %   Platelets 182 150 - 400 K/uL  Basic metabolic panel     Status: Abnormal   Collection Time: 05/17/15  3:52 AM  Result Value Ref Range   Sodium 138 135 - 145 mmol/L   Potassium 4.5 3.5 - 5.1 mmol/L    Comment: DELTA CHECK NOTED REPEATED TO VERIFY NO VISIBLE HEMOLYSIS    Chloride 103 101 - 111 mmol/L   CO2 27 22 - 32 mmol/L   Glucose, Bld 108 (H) 65 - 99 mg/dL   BUN 6 6 - 20 mg/dL   Creatinine, Ser 0.58 0.44 - 1.00 mg/dL   Calcium 8.7 (L) 8.9 - 10.3 mg/dL   GFR calc non Af Amer >60 >60 mL/min   GFR calc Af Amer >60 >60 mL/min    Comment: (NOTE) The eGFR has been calculated using the CKD EPI equation. This calculation has not been validated in all clinical situations. eGFR's persistently <60 mL/min signify possible Chronic Kidney Disease.    Anion gap 8 5 - 15  Magnesium     Status: None   Collection Time: 05/17/15  3:52 AM  Result Value Ref Range   Magnesium 2.2 1.7 - 2.4 mg/dL    Physical Findings: AIMS: Facial and Oral Movements Muscles of Facial Expression: None, normal Lips and Perioral Area: None, normal Jaw: None,  normal Tongue: None, normal,Extremity Movements Upper (arms, wrists, hands, fingers): None, normal Lower (legs, knees, ankles, toes): None, normal, Trunk Movements Neck, shoulders, hips: None, normal, Overall Severity Severity of abnormal movements (highest score from questions above): None, normal Incapacitation due to abnormal movements: None, normal Patient's awareness of abnormal movements (rate only patient's report): No Awareness, Dental Status Current problems with teeth and/or dentures?: No Does patient usually wear dentures?: No  CIWA:  CIWA-Ar Total: 2 COWS:  COWS Total Score: 4   See Psychiatric Specialty Exam and Suicide Risk Assessment completed by Attending Physician prior to discharge.  Discharge destination:  Home  Is patient on multiple antipsychotic therapies at discharge:  No   Has Patient had three or more failed trials of antipsychotic monotherapy by history:  No    Recommended Plan for Multiple Antipsychotic Therapies: NA     Medication List    TAKE these medications      Indication   hydrOXYzine 25 MG tablet  Commonly known as:  ATARAX/VISTARIL  Take 1 tablet (25 mg total) by mouth every 6 (six) hours as needed for anxiety.   Indication:  Anxiety  Neurosis           Follow-up Information    Follow up with [facility]. Schedule an appointment as soon as possible for a visit in 1 day.   Specialty:  Behavioral Health   Why:  For psychiatric follow-up ASAP after your inpatient stay.    Contact information:   [place] [phone]      Follow-up recommendations:  Activity:  As tolerated Diet:  Heart healthy with low sodium. Other:  Call downstairs [facility] Outpatient counseling ASAP to setup your appointment  Comments:   Take all medications as prescribed. Keep all follow-up appointments as scheduled.  Do not consume alcohol or use illegal drugs while on  prescription medications. Report any adverse effects from your medications to your primary care provider promptly.  In the event of recurrent symptoms or worsening symptoms, call 911, a crisis hotline, or go to the nearest emergency department for evaluation.   Total Discharge Time:  Greater than 30 minutes  Signed: Benjamine Mola, FNP-BC 05/18/2015, 11:32 AM  I personally assessed the patient and formulated the plan Geralyn Flash A. Sabra Heck, M.D.

## 2015-05-18 NOTE — BHH Counselor (Signed)
Psychosocial assessment not needed. Pt to be discharged within 24 hours of admission.   Maxie Better, LCSWA Clinical Social Worker 05/18/2015 9:53 AM

## 2015-05-18 NOTE — Progress Notes (Signed)
  [facility] Adult Case Management Discharge Plan :  Will you be returning to the same living situation after discharge:  Yes,  home with family At discharge, do you have transportation home?: Yes,  husband Do you have the ability to pay for your medications: Yes,  [employer] of information consent forms completed and submitted to medical records by CSW.  Patient to Follow up at: Follow-up Information    Follow up with Patient declined follow-up referrals. .      Patient denies SI/HI: Yes,  during group/self report.    Safety Planning and Suicide Prevention discussed: Yes,  SPE completed with pt's husband. Pt given SPI pamphlet and provided to pt.  Have you used any form of tobacco in the last 30 days? (Cigarettes, Smokeless Tobacco, Cigars, and/or Pipes): No  Has patient been referred to the Quitline?: N/A patient is not a smoker  Smart, [place] 05/18/2015, 9:54 AM

## 2015-05-18 NOTE — BHH Suicide Risk Assessment (Signed)
[facility] Discharge Suicide Risk Assessment   Demographic Factors:  Caucasian  Total Time spent with patient: 45 minutes  Musculoskeletal: Strength & Muscle Tone: within normal limits Gait & Station: normal Patient leans: normal  Psychiatric Specialty Exam: Physical Exam  ROS  Blood pressure 116/81, pulse 126, temperature 98.2 F (36.8 C), temperature source Oral, resp. rate 16, height 5' 6"  (1.676 m), weight 54.432 kg (120 lb), last menstrual period 05/01/2015, SpO2 97 %.Body mass index is 19.38 kg/(m^2).  General Appearance: Fairly Groomed  Engineer, water::  Fair  Speech:  Clear and [id number]  Volume:  Normal  Mood:  Euthymic  Affect:  Appropriate  Thought Process:  Coherent and Goal Directed  Orientation:  Full (Time, Place, and Person)  Thought Content:  plans as she moves on, relapse prevention plan  Suicidal Thoughts:  No  Homicidal Thoughts:  No  Memory:  Immediate;   Fair Recent;   Fair Remote;   Fair  Judgement:  Fair  Insight:  Present  Psychomotor Activity:  Normal  Concentration:  Fair  Recall:  [employer] [place]  Language: Fair  Akathisia:  No  Handed:  Right  AIMS (if indicated):     Assets:  Desire for Improvement Housing Intimacy Physical Health Social Support Talents/Skills Vocational/Educational  Sleep:  Number of Hours: 6.5  Cognition: WNL  ADL's:  Intact   Have you used any form of tobacco in the last 30 days? (Cigarettes, Smokeless Tobacco, Cigars, and/or Pipes): No  Has this patient used any form of tobacco in the last 30 days? (Cigarettes, Smokeless Tobacco, Cigars, and/or Pipes) No  Mental Status Per Nursing Assessment::   On Admission:     Current Mental Status by Physician: In full contact with reality. There are no active S/S of withdrawal. There are no active SI plans or intent. She is motivated to quit drinking and to pursue counseling to help with stress management.    Loss Factors: NA  Historical Factors: NA  Risk  Reduction Factors:   Responsible for children under 63 years of age, Sense of responsibility to family, Religious beliefs about death, Employed, Living with another person, especially a relative, Positive social support and Positive coping skills or problem solving skills  Continued Clinical Symptoms:  Alcohol/Substance Abuse/Dependencies  Cognitive Features That Contribute To Risk:  None    Suicide Risk:  Minimal: No identifiable suicidal ideation.  Patients presenting with no risk factors but with morbid ruminations; may be classified as minimal risk based on the severity of the depressive symptoms  Principal Problem: Alcohol abuse with intoxication [facility]) Discharge Diagnoses:  Patient Active Problem List   Diagnosis Date Noted  . Alcohol abuse with intoxication ([place]) [F10.129] 05/16/2015  . Overdose of antidepressant [T43.201A] 05/15/2015  . Overdose of antipsychotic [T43.501A] 05/15/2015  . Altered mental status [R41.82] 05/15/2015  . Acute respiratory failure ([place]) [J96.00] 05/15/2015  . Polysubstance overdose [T50.901A] 05/15/2015  . QT prolongation [I45.81] 05/15/2015    Follow-up Information    Follow up with [facility]. Schedule an appointment as soon as possible for a visit in 1 day.   Specialty:  Behavioral Health   Why:  For psychiatric follow-up ASAP after your inpatient stay.    Contact information:   [place] [phone]      Plan Of Care/Follow-up recommendations:  Activity:  as tolerated Diet:  regular Follow up as above Is patient on multiple antipsychotic therapies at discharge:  No  Has Patient had three or more failed trials of antipsychotic monotherapy by history:  No  Recommended Plan for Multiple Antipsychotic Therapies: NA    Emory Leaver A 05/18/2015, 12:14 PM

## 2015-05-18 NOTE — Progress Notes (Signed)
D: Patient in her room and tearful on approach.  Patient states she feels frightened and does not feels she needs to be here.  Patient does not appear to have insight.  Patient denies SI/HI and denies AVH.  A: Staff to monitor Q 15 mins for safety.  Encouragement and support offered.  Scheduled medications administered per orders. R: Patient remains safe on the unit.  Patient attended group tonight.  Patient visible on the unit.  Patient taking administered medications.

## 2015-05-18 NOTE — Progress Notes (Signed)
Pt discharged home with husband. Pt was ambulatory and well at the time of discharge. All papers and prescriptions were given and valuables returned. [place]  understanding expressed. Denies SI/HI and A/VH. Pt given opportunity to express concerns and asked questions.

## 2015-05-18 NOTE — Tx Team (Signed)
Interdisciplinary Treatment Plan Update (Adult)  Date:  05/18/2015  Time Reviewed:  8:39 AM   Progress in Treatment: Attending groups: Yes. Participating in groups:  Yes. Taking medication as prescribed:  Yes. Tolerating medication:  Yes. Family/Significant othe contact made:   Patient understands diagnosis:  Yes. and As evidenced by:  seeking treatment for Discussing patient identified problems/goals with staff:  Yes. Medical problems stabilized or resolved:  Yes. Denies suicidal/homicidal ideation: Yes. Issues/concerns per patient self-inventory:  Other:  Discharge Plan or Barriers: CSW assessing. Pt plans to return home with her family. Signed 72 hour request for d/c on 05/17/15. She declined follow-up at this time and reports that her OBGYN prescribed her Lexapro for anxiety. "I have Rx for a year." Pt declined counseling at this time.   Reason for Continuation of Hospitalization: none  Comments:  This is 39 yrs old female came to [facility]. Pt has been tearful through the admission, pt stated she feel like a prisoner and she is not crazy, pt also stated that she brought in on herself. Pt is worried about her 7 yrs old daughter who is currently stay with the father, pt thinks that she is not having enough time the daughter even they have shared custody. Pt denied physical, has a sore throat post intubation, denied SI, HI and contracted for safety. Pt introduced to the unit, although continues to cry. Safety maintained, we will continue to monitor.  Estimated length of stay:  D/c today   Additional Comments:  Patient and CSW reviewed pt's identified goals and treatment plan. Patient verbalized understanding and agreed to treatment plan. CSW reviewed [facility] "Discharge Process and Patient Involvement" Form. Pt verbalized understanding of information provided and signed form.    Review of initial/current patient goals per problem list:  1. Goal(s): Patient will participate in  aftercare plan  Met: Yes  Target date: at discharge  As evidenced by: Patient will participate within aftercare plan AEB aftercare provider and housing plan at discharge being identified.  11/2: Pt reports that she will return home and follow-up with OBGYN.   2. Goal (s): Patient will exhibit decreased depressive symptoms and suicidal ideations.  Met: Yes   Target date: at discharge  As evidenced by: Patient will utilize self rating of depression at 3 or below and demonstrate decreased signs of depression or be deemed stable for discharge by MD.  11/2: Pt reports that she has no depression, SI/HI/AVH.  4. Goal(s): Patient will demonstrate decreased signs of withdrawal due to substance abuse  Met:Yes  Target date:at discharge   As evidenced by: Patient will produce a CIWA/COWS score of 0, have stable vitals signs, and no symptoms of withdrawal.  11/2: Pt reports that she has no withdrawals and refused detox medication. Pt has stable vitals and no CIWA score.    Attendees: Patient:   05/18/2015 8:39 AM   Family:   05/18/2015 8:39 AM   Physician:  Dr. Carlton Adam, MD 05/18/2015 8:39 AM   Nursing:   De Burrs RN 05/18/2015 8:39 AM   Clinical Social Worker: Maxie Better, [place]  05/18/2015 8:39 AM   Clinical Social Worker: Erasmo Downer Drinkard LCSWA; Peri Maris LCSWA 05/18/2015 8:39 AM   Other:  Gerline Legacy Nurse Case Manager 05/18/2015 8:39 AM   Other:  Lucinda Dell; [facility] TCT  05/18/2015 8:39 AM   Other:   05/18/2015 8:39 AM   Other:  05/18/2015 8:39 AM   Other:  05/18/2015 8:39 AM   Other:  05/18/2015  8:39 AM    05/18/2015 8:39 AM    05/18/2015 8:39 AM    05/18/2015 8:39 AM    05/18/2015 8:39 AM    Scribe for Treatment Team:   Maxie Better, [place]  05/18/2015 8:39 AM

## 2019-05-15 ENCOUNTER — Other Ambulatory Visit: Payer: Self-pay | Admitting: Chiropractic Medicine

## 2019-05-15 DIAGNOSIS — G8929 Other chronic pain: Secondary | ICD-10-CM

## 2019-05-15 DIAGNOSIS — M545 Low back pain, unspecified: Secondary | ICD-10-CM

## 2019-06-01 ENCOUNTER — Other Ambulatory Visit: Payer: Self-pay

## 2019-06-01 ENCOUNTER — Ambulatory Visit
Admission: RE | Admit: 2019-06-01 | Discharge: 2019-06-01 | Disposition: A | Discharge status: Home / Self Care | Payer: 59 | Source: Ambulatory Visit | Attending: Chiropractic Medicine | Admitting: Chiropractic Medicine

## 2019-06-01 DIAGNOSIS — M545 Low back pain, unspecified: Secondary | ICD-10-CM

## 2019-06-01 DIAGNOSIS — G8929 Other chronic pain: Secondary | ICD-10-CM

## 2019-10-05 ENCOUNTER — Other Ambulatory Visit: Payer: Self-pay | Admitting: Chiropractic Medicine

## 2019-10-05 DIAGNOSIS — G8929 Other chronic pain: Secondary | ICD-10-CM

## 2019-10-06 ENCOUNTER — Ambulatory Visit
Admission: RE | Admit: 2019-10-06 | Discharge: 2019-10-06 | Disposition: A | Discharge status: Home / Self Care | Payer: 59 | Source: Ambulatory Visit | Attending: Chiropractic Medicine | Admitting: Chiropractic Medicine

## 2019-10-06 ENCOUNTER — Other Ambulatory Visit: Payer: Self-pay

## 2019-10-06 DIAGNOSIS — G8929 Other chronic pain: Secondary | ICD-10-CM

## 2019-10-06 DIAGNOSIS — M25551 Pain in right hip: Secondary | ICD-10-CM

## 2019-10-06 MED ORDER — GADOBENATE DIMEGLUMINE 529 MG/ML IV SOLN
11.0000 mL | Freq: Once | INTRAVENOUS | Status: AC | PRN
  Administered 2019-10-06: 11 mL via INTRAVENOUS

## 2021-02-21 ENCOUNTER — Ambulatory Visit (HOSPITAL_BASED_OUTPATIENT_CLINIC_OR_DEPARTMENT_OTHER): Payer: 59 | Admitting: Family Medicine

## 2021-02-21 ENCOUNTER — Ambulatory Visit (HOSPITAL_BASED_OUTPATIENT_CLINIC_OR_DEPARTMENT_OTHER)
Admission: RE | Admit: 2021-02-21 | Discharge: 2021-02-21 | Disposition: A | Discharge status: Home / Self Care | Payer: 59 | Source: Ambulatory Visit | Attending: Family Medicine | Admitting: Family Medicine

## 2021-02-21 ENCOUNTER — Other Ambulatory Visit: Payer: Self-pay

## 2021-02-21 ENCOUNTER — Encounter (HOSPITAL_BASED_OUTPATIENT_CLINIC_OR_DEPARTMENT_OTHER): Payer: Self-pay | Admitting: Family Medicine

## 2021-02-21 VITALS — BP 112/68 | HR 87 | Ht 66.0 in | Wt 129.0 lb

## 2021-02-21 DIAGNOSIS — G8929 Other chronic pain: Secondary | ICD-10-CM | POA: Diagnosis not present

## 2021-02-21 DIAGNOSIS — M25562 Pain in left knee: Secondary | ICD-10-CM | POA: Diagnosis present

## 2021-02-21 DIAGNOSIS — Z1211 Encounter for screening for malignant neoplasm of colon: Secondary | ICD-10-CM

## 2021-02-21 DIAGNOSIS — M25531 Pain in right wrist: Secondary | ICD-10-CM

## 2021-02-21 DIAGNOSIS — M25539 Pain in unspecified wrist: Secondary | ICD-10-CM | POA: Insufficient documentation

## 2021-02-21 DIAGNOSIS — M25532 Pain in left wrist: Secondary | ICD-10-CM

## 2021-02-21 NOTE — Progress Notes (Signed)
New Patient Office Visit  Subjective:  Patient ID: Kristine Burton, female    DOB: 1975/09/17  Age: 45 y.o. 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 Patient states she feels like she has flares when the pain is worse. She is an avid in fitness and state he pain is starting to impact her ability to workout (ex she cannot bare weight on her wrists to do push ups)     HPI Kristine Burton is a 45 year old female presenting to establish in clinic.  She has current concerns today related to bilateral wrist pain as well as left knee pain.  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  Bilateral wrist pain: First noticed about 1 year ago.  Pain is primarily present when trying to do push-ups and planks, particularly when holding weight up with wrist in extension.  Pain is located centrally over wrist.  Will occasionally have some numbness/tingling through thumb, index, middle, ring fingers and hands bilaterally.  Denies any nighttime awakenings with symptoms or early morning symptoms.  Otherwise, no specific activities that will trigger symptoms.  Denies any prior wrist injuries.  Left knee pain: The pain has been present for about 3 to 4 months.  Over approximately the same timeframe she has also had intermittent posterior knee swelling.  She was evaluated with physical therapist downstairs and was told that her swelling was likely related to Baker's cyst.  She rates the pain at about a 3 out of 10.  Pain is worse with increased activity, walking, stairs.  She used to run frequently in the past prior to hip surgery for labral tear.  Patient works at  [employer] here -works downstairs in Systems analyst!  Past Medical History:  Diagnosis Date   Anxiety     No past surgical history on file.  Family History  Problem Relation Age of Onset   Hypertension Mother    Stroke Father    Diabetes Father    Hypertension Father    Parkinson's disease Father    Heart attack Father    Hypertension Sister    Diabetes Maternal Grandmother    Diabetes Paternal Grandmother     Social History   Socioeconomic History   Marital status: Married    Spouse name: Not on file   Number of children: Not on file   Years of education: Not on file   Highest education level: Not on file  Occupational History   Occupation: Care Coordinator    Employer: [employer]    Comment: Works at [employer] Use   Smoking status: Never    Passive exposure: Never   Smokeless tobacco: Never  Vaping Use   Vaping Use: Never used  Substance and Sexual Activity   Alcohol use: Not Currently   Drug use: Never   Sexual activity: Yes  Other Topics Concern   Not on file  Social History Narrative   Not on file   Social Determinants of Health   Financial Resource Strain: Not on file  Food Insecurity: Not on file  Transportation Needs: Not on file  Physical Activity: Not on file  Stress: Not on file  Social Connections: Not on file  Intimate Partner Violence: Not on file    Objective:   Today's Vitals: BP 112/68   Pulse 87   Ht 5' 6"  (1.676 m)   Wt 129 lb (58.5 kg)   LMP 02/14/2021 (Exact Date)   SpO2 97%   BMI 20.82 kg/m   Physical Exam  Pleasant 45 year old female in no acute distress Cardiovascular exam with regular rate and rhythm, no murmurs appreciated Lungs clear to auscultation bilaterally Bilateral wrist: Obvious swelling, bruising or erythema: absent Deformity of the wrist: absent Active ROM: full range of motion Passive ROM: full range of motion Strength: Normal strength for wrist flexion, extension, radial deviation, ulnar  deviation.  Mild pain elicited Positive reverse Phalen's with pain after about 15 seconds, tingling after 25 seconds. Neurovascular exam: intact  Assessment & Plan:   Problem List Items Addressed This Visit       Other   Left knee pain    Chronic, intermittent, suspecting underlying OA, possible degenerative meniscal tear Will evaluate initially with x-rays Can use conservative measures to help with pain control including OTC medications, heat/ice Further treatment recommendations pending results of imaging, may consider physical therapy, corticosteroid injection       Relevant Orders   DG Knee Complete 4 Views Left   Wrist pain    Chronic, intermittent Possible underlying OA, some component of carpal tunnel syndrome bilaterally on history and exam Discussed options with patient, wishes to continue with conservative treatment, will make some adjustments in ergonomics, specifically related to exercising with use of dumbbells or bars in order to avoid placing weight on extended wrist If symptoms becoming more noticeable/problematic, likely would proceed with x-ray imaging initially.  Nerve conduction study or MRI may be warranted as follow-up studies pending results of x-ray and progress with symptoms       Other Visit Diagnoses     Screening for colon cancer    -  Primary   Relevant Orders   Cologuard     Discussed colon cancer screening guidelines including medical Society recommendations, screening methods including risks and benefits of each.  Patient elected proceed with Cologuard, order placed.  Outpatient Encounter Medications as of 02/21/2021  Medication Sig   sertraline (ZOLOFT) 50 MG tablet Take 1 tablet by mouth daily.   traZODone (DESYREL) 50 MG tablet Take 50 mg by mouth at bedtime.   [DISCONTINUED] escitalopram (LEXAPRO) 10 MG tablet Take 1 tablet (10 mg total) by mouth daily.   [DISCONTINUED] hydrOXYzine (ATARAX/VISTARIL) 25 MG tablet Take 1 tablet (25 mg total)  by mouth every 6 (six) hours as needed for anxiety.   No facility-administered encounter medications on file as of 02/21/2021.   Follow-up: Return if symptoms worsen or fail to improve.  Further treatment recommendations pending results of imaging  Elvan Ebron J De [place], MD

## 2021-02-21 NOTE — Patient Instructions (Signed)
  Medication Instructions:  Your physician recommends that you continue on your current medications as directed. Please refer to the Current Medication list given to you today. --If you need a refill on any your medications before your next appointment, please call your pharmacy first. If no refills are authorized on file call the office.--  Referrals/Procedures/Imaging: Your physician recommends that you have an X-ray of Left Knee.  X-rays use invisible electromagnetic energy beams to produce images of internal tissues, bones, and organs on film or digital media. Standard X-rays are performed for many reasons, including diagnosing tumors or bone injuries.    You may have these images done at the [place] located at [facility] on the ground floor, [phone]. They are a walk-in imaging facility. The hours of operation are:   Monday -- 7:30 AM - 5:00 PM Tuesday -- 7:30 AM - 5:00 PM Wednesday -- 7:30 AM - 5:00 PM Thursday -- 7:30 AM - 5:00 PM Friday -- 7:30 AM - 5:00 PM Saturday -- Closed Sunday -- Closed.  Your physician recommends that you have a Cologuard screening for Colon Cancer. Cologuard is intended to screen adults 36 years of age and older who are at average risk for colorectal cancer by detecting certain DNA markers and blood in the stool. Do not use if you have had adenomas, have inflammatory bowel disease and certain hereditary syndromes, or a personal or family history of colorectal cancer. Cologuard is not a replacement for colonoscopy in high risk patients. Cologuard performance in adults ages 44-49 is estimated based on a large clinical study of patients 33 and older.   Cologuard is shipped directly to patients' homes upon ordering. Accompanied by 2 months of personalized outreach to facilitate screening completion, including a welcome call as well as mail, email, or text message reminders.   Cologuard can be completed in the comfort of home  and does not require special preparation, dietary or medication changes, sedation, or time off from work. Patients should plan to collect their sample when they can get it back to [employer] that same day or the next day.   The [place] is available 24/7 to help patients complete their Cologuard collection kit at [phone].  Patients can choose the no-cost return option that works best for them. They may:  Request a contact-free [employer] pickup: Call [phone] for help or visit [email or web address] to schedule it on their own.   Drop it off at [employer]: Visit [email or web address] to see local options and hours. Remember, some locations are closed on Sundays or holidays.   Follow-Up: Your next appointment:   Your physician recommends that you schedule a follow-up appointment in: AS NEEDED with Dr. de [place]  Thanks for letting us be apart of your health journey!!  Primary Care and Sports Medicine   Dr. Arlina Robes [place]   We encourage you to activate your patient portal called "MyChart".  Sign up information is provided on this After Visit Summary.  MyChart is used to connect with patients for Virtual Visits (Telemedicine).  Patients are able to view lab/test results, encounter notes, upcoming appointments, etc.  Non-urgent messages can be sent to your provider as well. To learn more about what you can do with MyChart, please visit --  [email or web address].

## 2021-02-21 NOTE — Assessment & Plan Note (Signed)
Chronic, intermittent, suspecting underlying OA, possible degenerative meniscal tear Will evaluate initially with x-rays Can use conservative measures to help with pain control including OTC medications, heat/ice Further treatment recommendations pending results of imaging, may consider physical therapy, corticosteroid injection

## 2021-02-21 NOTE — Assessment & Plan Note (Signed)
Chronic, intermittent Possible underlying OA, some component of carpal tunnel syndrome bilaterally on history and exam Discussed options with patient, wishes to continue with conservative treatment, will make some adjustments in ergonomics, specifically related to exercising with use of dumbbells or bars in order to avoid placing weight on extended wrist If symptoms becoming more noticeable/problematic, likely would proceed with x-ray imaging initially.  Nerve conduction study or MRI may be warranted as follow-up studies pending results of x-ray and progress with symptoms

## 2021-03-14 LAB — COLOGUARD: Cologuard: NEGATIVE

## 2021-03-22 ENCOUNTER — Telehealth (HOSPITAL_BASED_OUTPATIENT_CLINIC_OR_DEPARTMENT_OTHER): Payer: Self-pay

## 2021-03-22 NOTE — Telephone Encounter (Signed)
Per DPR left detailed message of negative test results and recommendation to repeat screening in 3 years Instructed patient to contact the office if she has any questions or concerns Informed patient that results are available for her to view vis mychart.

## 2021-03-22 NOTE — Telephone Encounter (Signed)
-----   Message from Raymond J de [place]am, MD sent at 03/15/2021  2:33 PM EDT ----- Cologuard testing is negative which is reassuring.  Recommendation is to repeat colon cancer screening in 3 years.

## 2021-12-25 ENCOUNTER — Other Ambulatory Visit: Payer: 59

## 2022-06-12 IMAGING — DX DG KNEE COMPLETE 4+V*L*
4 series · 4 of 4 positions shown · non-contrast
Comparison: None.

CLINICAL DATA: Baker cyst

EXAM:
LEFT KNEE - COMPLETE 4+ VIEW

[knee ap]
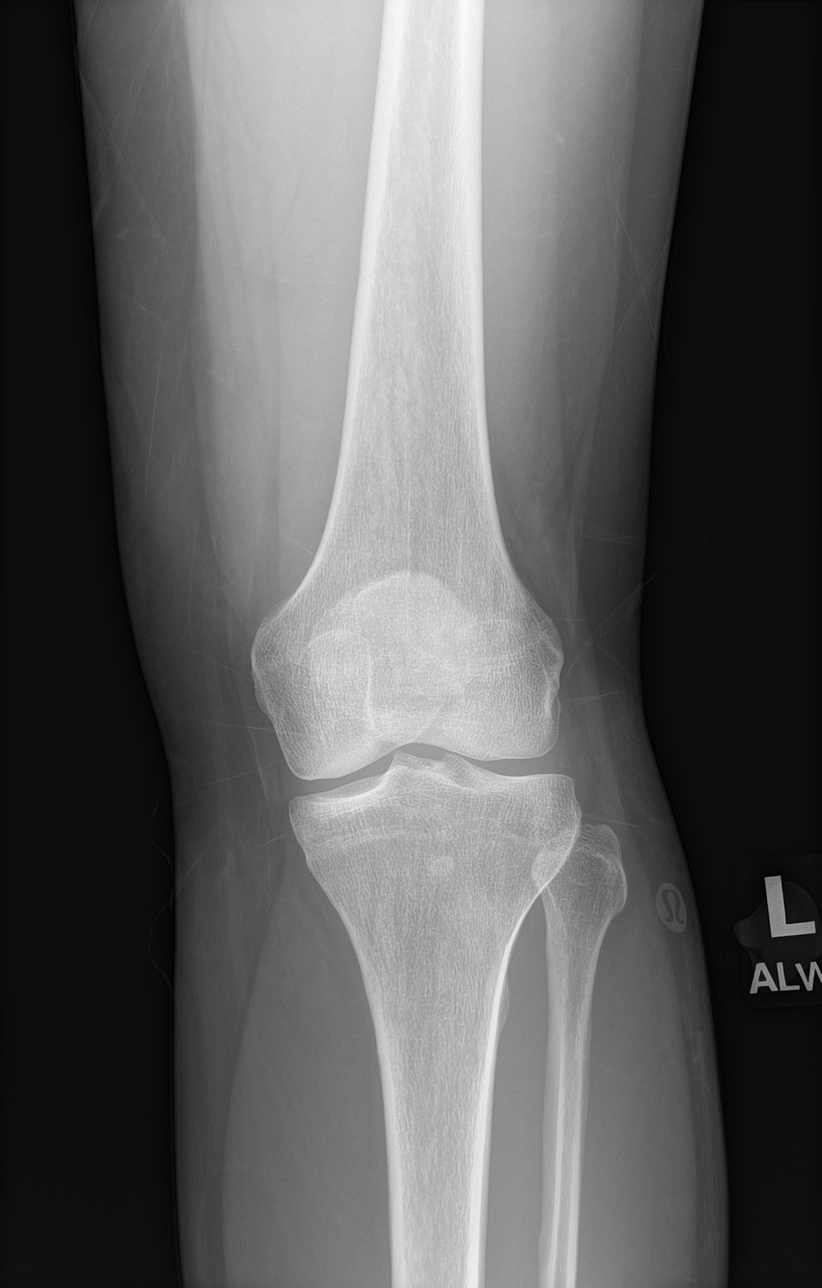

[knee obl (1 of 2)]
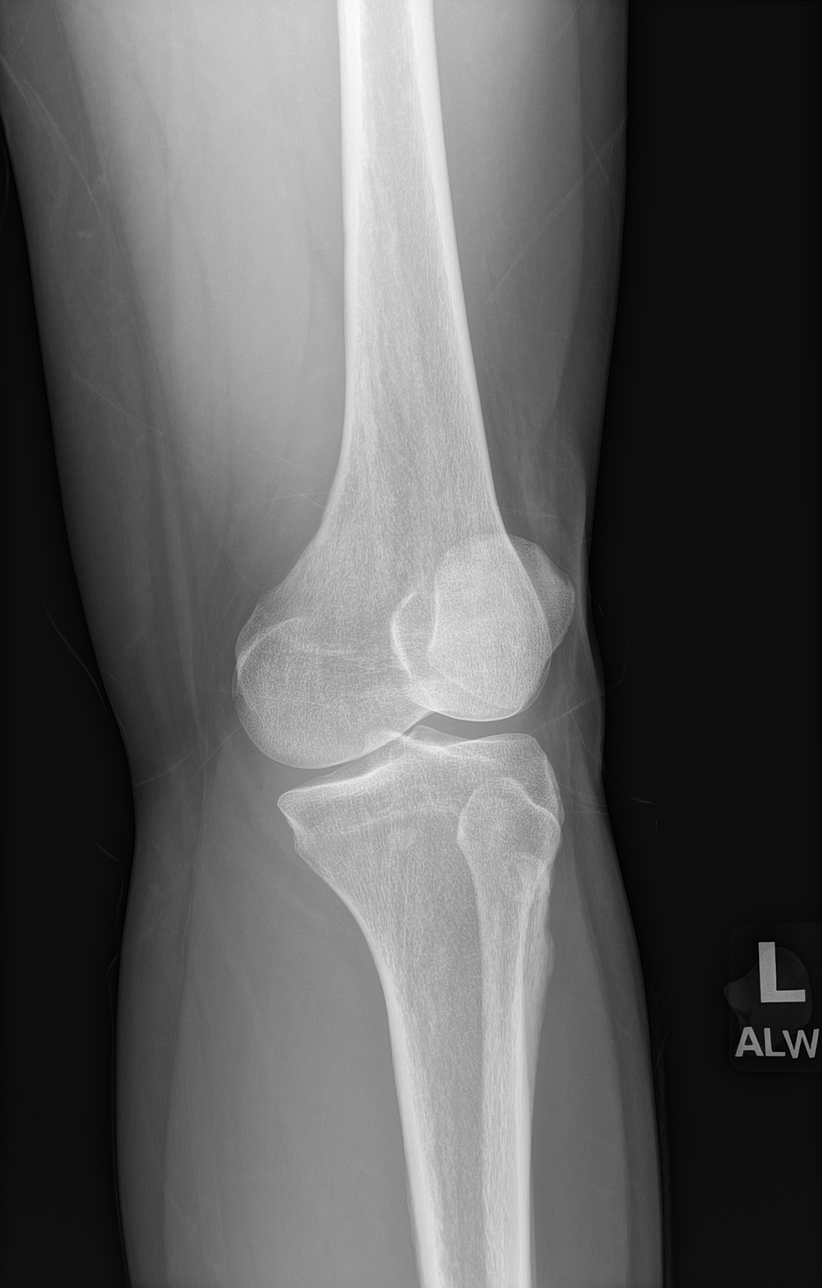

[knee obl (2 of 2)]
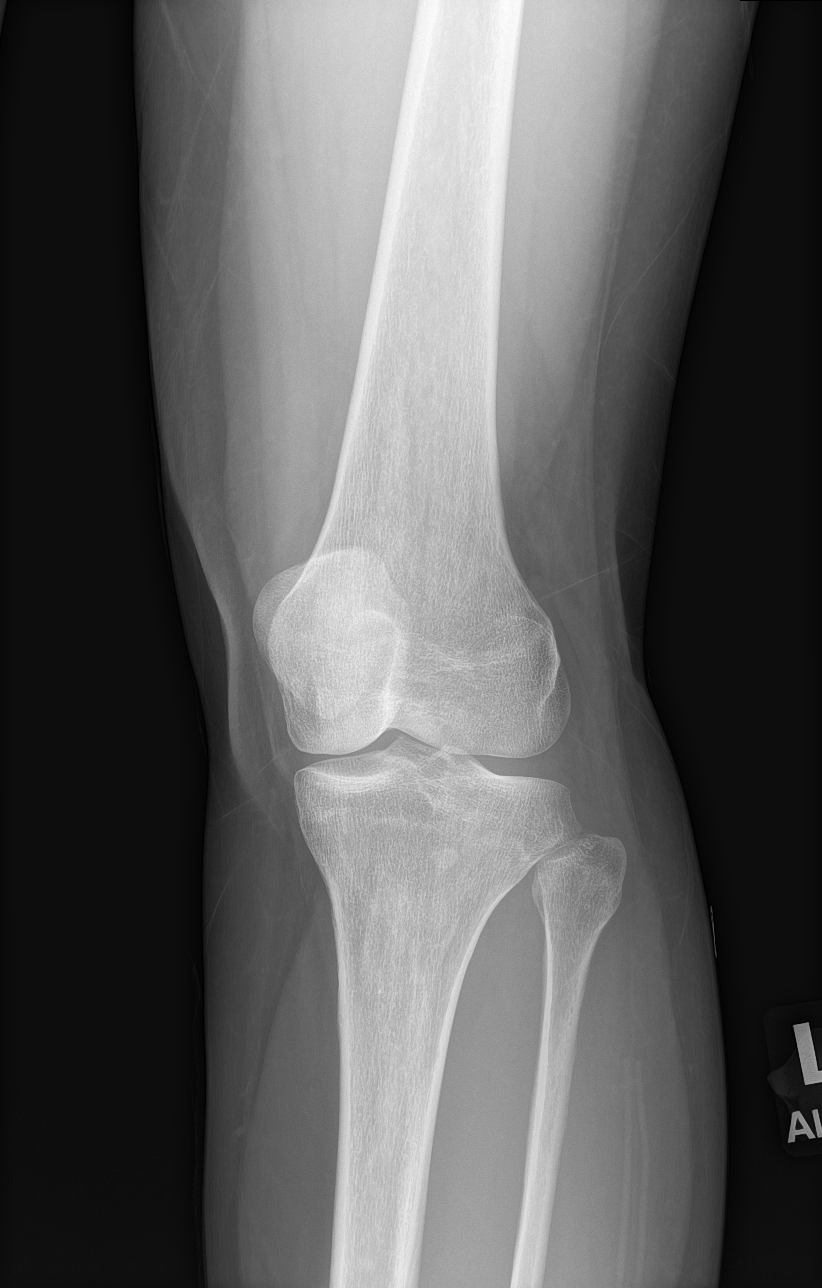

[knee lat]
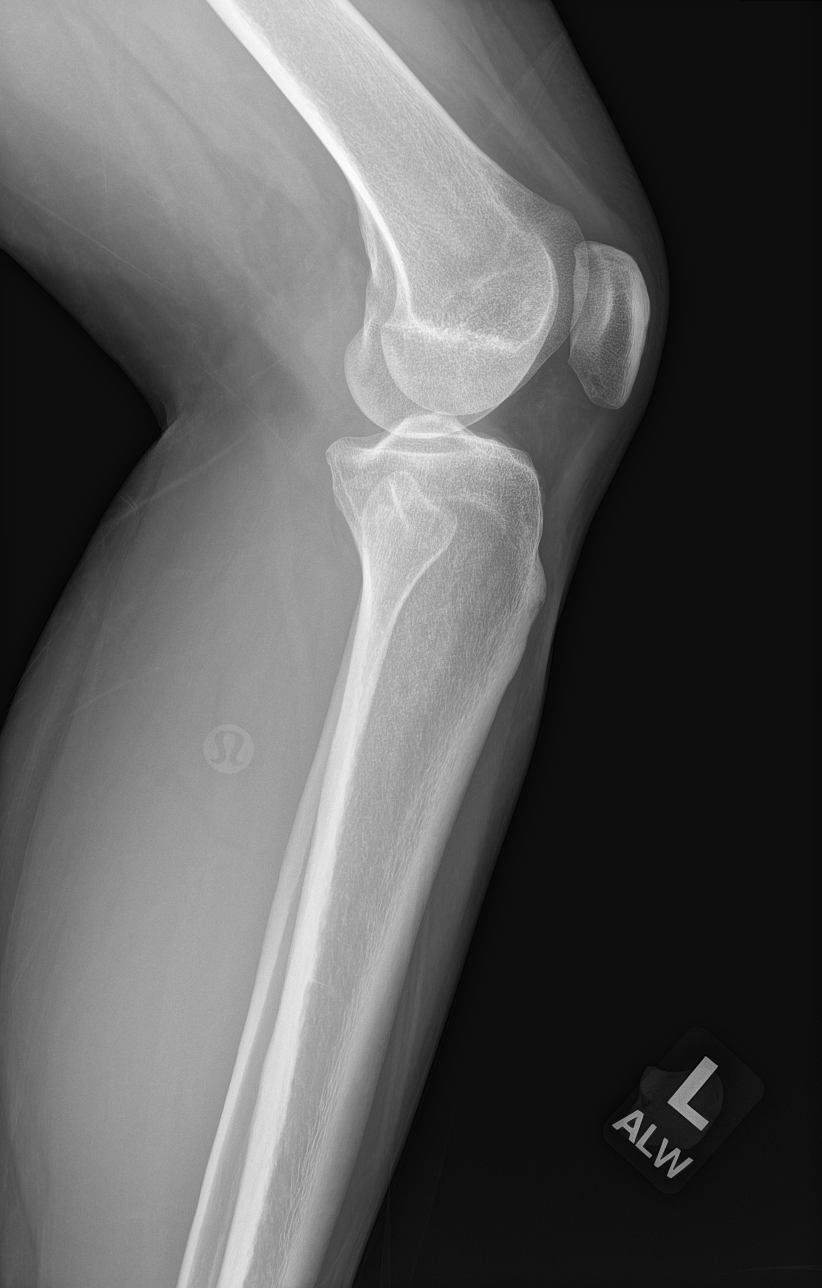

[4 of 4 positions shown; findings below may reference images not displayed]

FINDINGS: There is no acute fracture or dislocation. Alignment is normal. The
joint spaces are preserved. There is no effusion. The soft tissues
are normal.
IMPRESSION: Unremarkable knee radiographs.  No soft tissue mass identified.

## 2022-12-20 ENCOUNTER — Encounter: Payer: Self-pay | Admitting: Genetic Counselor

## 2022-12-20 ENCOUNTER — Telehealth: Payer: Self-pay | Admitting: Genetic Counselor

## 2022-12-20 NOTE — Telephone Encounter (Signed)
Called Kristine Burton to inform her of father's genetic testing results given she was on HIPAA/ROI form.  Father passed away from pancreatic cancer and had genetic testing before passing, which showed a VUS in MEN1.  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  Recommended genetic testing for her father's relatives.  Genetics appointment scheduled for [facility] for 12/26/2022 at 2pm.

## 2022-12-26 ENCOUNTER — Inpatient Hospital Stay: Payer: 59

## 2022-12-26 ENCOUNTER — Other Ambulatory Visit: Payer: Self-pay

## 2022-12-26 ENCOUNTER — Other Ambulatory Visit: Payer: Self-pay | Admitting: Genetic Counselor

## 2022-12-26 ENCOUNTER — Inpatient Hospital Stay: Payer: 59 | Attending: Nurse Practitioner | Admitting: Genetic Counselor

## 2022-12-26 DIAGNOSIS — Z8481 Family history of carrier of genetic disease: Secondary | ICD-10-CM | POA: Diagnosis not present

## 2022-12-26 DIAGNOSIS — Z800 Family history of malignant neoplasm of digestive organs: Secondary | ICD-10-CM | POA: Diagnosis not present

## 2022-12-26 LAB — GENETIC SCREENING ORDER

## 2022-12-28 ENCOUNTER — Encounter: Payer: Self-pay | Admitting: Genetic Counselor

## 2022-12-28 NOTE — Progress Notes (Signed)
REFERRING PROVIDER: Self-referred  PRIMARY PROVIDER:  [place], Buren Kos, MD  PRIMARY REASON FOR VISIT:  1. Family history of MEN1 gene mutation   2. Family history of pancreatic cancer     HISTORY OF PRESENT ILLNESS:   Kristine Burton, a 47 y.o. female, was seen for a [facility] cancer genetics consultation.  Her father had genetic testing before passing away given his personal and family history of pancreatic cancer.  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   Kristine Burton presents to clinic today to discuss the possibility of a hereditary predisposition to cancer, to discuss genetic testing, and to further clarify her future cancer/tumor risks, as well as potential cancer/tumor risks for daughters.   Kristine Burton is a 47 y.o. female with no personal history of cancer.    CANCER HISTORY:  Oncology History   No history exists.     RISK FACTORS:  Colonoscopy: yes;  no history of colon polyps . Patient denied any known features of hyperparathyroidism (hypercalcemia, osteoporosis, kidney stones, etc.)  Past Medical History:  Diagnosis Date   Anxiety      FAMILY HISTORY:  We obtained a detailed, 4-generation family history.  Significant diagnoses are listed below: Family History  Problem Relation Age of Onset   Pancreatic cancer Father 34   Other Father        MEN1 gene mutation   Hyperparathyroidism Father    Pancreatic cancer Paternal Uncle 70       d. 55   Colon cancer Paternal Grandmother        dx 51s   Cancer Other        PGM's brothers with nasal cancer and metastatic prostate cancer        Kristine Burton's father had genetic testing in October 2022 for the [facility] +RNAinsight Panel.  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 The [facility]cer +RNAinsight Panel offered by [facility] and includes sequencing and rearrangement analysis for the following 91 genes: AIP, ALK, APC, ATM, AXIN2, BAP1, BARD1, BLM, BMPR1A, BRCA1, BRCA2, BRIP1, CDC73, CDH1, CDK4, CDKN1B, CDKN2A, CHEK2, CTNNA1, DICER1, FANCC, FH, FLCN, GALNT12, KIF1B, LZTR1, MAX, MEN1, MET, MLH1, MRE11A, MSH2, MSH3, MSH6, MUTYH, NBN, NF1, NF2, NTHL1, PALB2, PHOX2B, PMS2, POT1, PRKAR1A, PTCH1, PTEN, RAD50, RAD51C, RAD51D, RB1, RECQL, RET, SDHA, SDHAF2, SDHB, SDHC, SDHD, SMAD4, SMARCA4, SMARCB1, SMARCE1, STK11, SUFU, TMEM127, TP53, TSC1, TSC2, VHL and XRCC2 (sequencing and deletion/duplication); CASR, CFTR, CPA1, CTRC, EGFR, EGLN1, FAM175A, HOXB13, KIT, MITF, MLH3, PALLD, PDGFRA, POLD1, POLE, PRSS1, RINT1, RPS20, SPINK1 and TERT (sequencing only); EPCAM and GREM1 (deletion/duplication only). RNA data is routinely analyzed for use in variant interpretation for all genes.    Kristine Burton is unaware of other of previous family history of genetic testing for hereditary cancer risks. There is no reported Ashkenazi Jewish ancestry. There is no known consanguinity.  GENETIC COUNSELING ASSESSMENT: Kristine Burton is a 47 y.o. female with a family history of a known mutation in the MEN1 gene.  We, therefore, discussed and recommended the following at today's visit.   DISCUSSION: We discussed that 5 - 10% of cancer is hereditary. Given her father had a mutation in the MEN1 gene, she has a 50% chance of having the same family mutation in MEN1.  We reviewed features/risks associated with mutations in MEN1 (hyperparathyroidism, pituitary tumors, pancreatic neuroendocrine tumors, other carcinoid tumors) and appropriate management strategies.  We discussed that testing is beneficial for several reasons, including knowing about other cancer risks, identifying potential screening and risk-reduction options that may be appropriate, and to understanding if other family members could be at  risk for cancer and allowing them to undergo genetic testing.  We recommended Kristine Burton pursue genetic testing for the known MEN1 mutation that was detected in her father.  We discussed that other genes (panel-testing) is available; however, her maternal family history is not highly suggestive of a hereditary cancer syndrome.  Kristine Burton was interested in proceeding with MEN1 family variant test.  She meets [facility]'s no charge family variant testing criteria at this time.   We discussed the Genetic Information Non-Discrimination Act (GINA) of 2008, which helps protect individuals against genetic discrimination based on their genetic test results.  It impacts both health insurance and employment.  With health insurance, it protects against genetic test results being used for increased premiums or policy termination. For employment, it protects against hiring, firing and promoting decisions based on genetic test results.  GINA does not apply to those in the [employer], those who work for companies with less than 15 employees, and new life insurance or long-term disability insurance policies.  Health status due to a cancer diagnosis is not protected under GINA.  We discussed that her father's pancreatic adenocarcinoma is not explained by the mutation in MEN1.  A hereditary cause for the personal and family history of pancreatic adenocarcinoma was not identified in her father.  Per the International Cancer of the Pancreas Screening (CAPS) Consortium, individuals with a family history of pancreatic cancer in at least one first degree relative and one second degree relative should consider annual pancreatic cancer screening. Given that her father and paternal uncle both had pancreatic cancer, she meets CAPS criteria for consideration of pancreatic cancer screening.  She knows that a referral to [facility] for a consultation is available, if interested.   PLAN: After considering the risks, benefits, and limitations, Ms.  Burton provided informed consent to pursue genetic testing and the blood sample was sent to [facility] for site specific testing of MEN1 (family variant is MEN1 [id number] (p.A213E)). Results should be available within approximately 2-3 weeks' time, at which point they will be disclosed by telephone to Kristine Burton, as will any additional recommendations warranted by these results. Kristine Burton will receive a summary of her genetic counseling visit and a copy of her results once available. This information will also be available in Epic.   Kristine Burton questions were answered to her satisfaction today. Our contact information was provided should additional questions or concerns arise.   Darlena Koval M. Rennie Plowman, MS, [facility] Genetic Counselor [email or web address] (P) [phone]   The patient was seen for a total of 40 minutes in face-to-face genetic counseling.  The patient was accompanied by her husband, Thayer Ohm.  Drs. Gunnar Bulla, and/or [place] were available to discuss this case as needed.  _______________________________________________________________________ For Office Staff:  Number of people involved in session: 2 Was an Intern/ student involved with case: no

## 2023-01-09 ENCOUNTER — Telehealth: Payer: Self-pay | Admitting: Genetic Counselor

## 2023-01-09 ENCOUNTER — Ambulatory Visit: Payer: Self-pay | Admitting: Genetic Counselor

## 2023-01-09 DIAGNOSIS — Z800 Family history of malignant neoplasm of digestive organs: Secondary | ICD-10-CM

## 2023-01-09 DIAGNOSIS — Z8481 Family history of carrier of genetic disease: Secondary | ICD-10-CM

## 2023-01-09 DIAGNOSIS — Z1379 Encounter for other screening for genetic and chromosomal anomalies: Secondary | ICD-10-CM

## 2023-01-09 DIAGNOSIS — Z1589 Genetic susceptibility to other disease: Secondary | ICD-10-CM

## 2023-01-09 NOTE — Telephone Encounter (Signed)
Disclosed results of genetic testing.  MEN1 positive.  Patient agreed to referral to [facility].  Declined referral to [facility] for pancreatic cancer screening (given family history of pancreatic adenocarcinoma).  Discussed family implications.

## 2023-01-10 ENCOUNTER — Encounter: Payer: Self-pay | Admitting: Genetic Counselor

## 2023-01-10 DIAGNOSIS — Z1379 Encounter for other screening for genetic and chromosomal anomalies: Secondary | ICD-10-CM | POA: Insufficient documentation

## 2023-01-10 DIAGNOSIS — Z1589 Genetic susceptibility to other disease: Secondary | ICD-10-CM | POA: Insufficient documentation

## 2023-01-10 HISTORY — DX: Genetic susceptibility to other disease: Z15.89

## 2023-01-10 NOTE — Progress Notes (Signed)
GENETIC TEST RESULTS  Patient Name: Kristine Burton Patient Age: 47 y.o. 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  Her father had genetic testing in October 2022 after his diagnosis of pancreatic adenocarcinoma, which revealed a variant of uncertain significance (VUS) in the MEN1 gene.  In April 2024, after her father's passing, her father's VUS was upgraded to likely pathogenic.  Kristine Burton proceeded with genetic testing to understand more about her risk for MEN1-related tumors/cancer and risks to her daughters.   Please refer to the prior Genetics clinic note for more information regarding Kristine Burton's medical and family histories and our assessment at the time.    FAMILY HISTORY:  We obtained a detailed, 4-generation family history.  Significant diagnoses are listed below:      Family History  Problem Relation Age of Onset   Pancreatic cancer Father 28   Other Father          MEN1 gene mutation   Hyperparathyroidism Father     Pancreatic cancer Paternal Uncle 5        d. 23   Colon cancer Paternal Grandmother          dx 55s   Cancer Other          PGM's brothers with nasal cancer and metastatic prostate cancer             Kristine Burton's father had genetic testing in October 2022 for the [facility] CustomNext-Cancer +RNAinsight Panel.  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  In April 2024, the VUS in MEN1 was upgraded to likely pathogenic based on new [employer] RNA data. The CustomNext-Cancer +RNAinsight Panel offered by [facility] and includes sequencing and rearrangement analysis for the following 91 genes: AIP, ALK, APC, ATM, AXIN2, BAP1, BARD1, BLM, BMPR1A, BRCA1, BRCA2, BRIP1, CDC73, CDH1, CDK4, CDKN1B, CDKN2A, CHEK2, CTNNA1, DICER1, FANCC, FH, FLCN, GALNT12, KIF1B, LZTR1, MAX, MEN1, MET, MLH1,  MRE11A, MSH2, MSH3, MSH6, MUTYH, NBN, NF1, NF2, NTHL1, PALB2, PHOX2B, PMS2, POT1, PRKAR1A, PTCH1, PTEN, RAD50, RAD51C, RAD51D, RB1, RECQL, RET, SDHA, SDHAF2, SDHB, SDHC, SDHD, SMAD4, SMARCA4, SMARCB1, SMARCE1, STK11, SUFU, TMEM127, TP53, TSC1, TSC2, VHL and XRCC2 (sequencing and deletion/duplication); CASR, CFTR, CPA1, CTRC, EGFR, EGLN1, FAM175A, HOXB13, KIT, MITF, MLH3, PALLD, PDGFRA, POLD1, POLE, PRSS1, RINT1, RPS20, SPINK1 and TERT (sequencing only); EPCAM and GREM1 (deletion/duplication only). RNA data is routinely analyzed for use in variant interpretation for all genes.     Kristine Burton is unaware of other of previous family history of genetic testing for hereditary cancer risks. There is no reported Ashkenazi Jewish ancestry. There is no known consanguinity.  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  This is the same likely pathogenic variant that was previously detected in her father.  Genetic testing was performed by [facility] [facility] and included site specific analysis of the known family variant in MEN1, only.   The test report has been scanned into EPIC and is located under the Molecular Pathology section of the Results Review tab.  A portion of the result report is included below for reference. Genetic testing reported out on January 04, 2023.     Clinical Information: Pathogenic or likely pathogenic variants in MEN1 are associated with multiple endocrine neoplasia type I (  MEN1) and familial isolated hyperparathyroidism (FIHP).   Individuals with MEN1 syndrome or FIHP have a higher chance of developing certain tumors and/or cancers of the endocrine system than the average person. MEN1 tumors are usually noncancerous, but sometimes can develop into cancer. Individuals with FIHP have been observed to only develop tumors or hyperplasia of the parathyroid glands leading to PHPT, but are  not affected with other MEN1-related features. Symptoms, severity, and age of onset of these conditions can vary.   MEN1 Cancer/Tumor Risks: Parathyroid adenoma/hyperplasia, >95% Pancreatic neuroendocrine tumor, 20-80% Pituitary adenoma, 30-40% Adrenal adenoma, 27-36% Gastric carcinoid, 7-35% Bronchial/thymic carcinoid, <8%  Individuals with MEN1 may also have benign growths of the skin (angiofibromas, collagenomas, lipomas), central nervous system (meningiomas, ependymomas) and uterus (leiomyomas).      Clinical Evaluation/[place] Recommendations:  After diagnosis of MEN1, evaluation should include (NCCN Neuroencorine and Adrenal Tumors, v1.2024):  Parathyroid: Serum calcium If calcium elevated PTH (parathyroid hormone), 25-OH vitamin D Imaging: neck ultrasound, parathyroid sestamibi with SPECT scan, or 4D.CT)   Pancreatic NET: Recommended Biochemical evaluation as clinically indicated Muliphasic abdomen with or without pelvic CT or MRI As appropriate: EUS SSTR-PET/CT or SSTR-PET/MRI  Pituitary: Recommended Pituitary or sella MRI with contrast Biochemical evaluation as clinically indicated  Bronchopulmonary/Thymic NETs Chest CT with contrast and multiphasic abdomen/pelvis CT or MRI Biochemical evaluation as clinically indicated    Patients with MEN1 should be screened for all of the following tumor types, starting at 97-47 years of age (NCCN Neuroencorine and Adrenal Tumors, v1.2024):   Parathyroid: Annual serum calcium levels If calcium rises: parathyroid hormone, 25-OH vitamin D, and re-imaging with neck ultrasound and/or parathyroid sestamibi with SPECT scan or 4D-CT   Pancreatic NET: Follow previously elevated serum hormones or as symptoms indicate Consider abdominal/pelvic CT or MRI with contrast every 1-3 y Consider serial EUS   Pituitary: Pituitary or sella MRI with contrast of pituitary every 3-5 y Prolactin, IGF-1, and other previously abnormal  pituitary hormones every 3-5 y or as symptoms indicate   Bronchopulmonary/Thymic NETs: Consider chest CT or MRI with contrast every 1-3 y   This information is based on current understanding of the gene and may change in the future.   Implications for Family Members: Hereditary predisposition to cancer due to pathogenic variants in the MEN1 gene has autosomal dominant inheritance. This means that an individual with a pathogenic variant has a 50% chance of passing the condition on to his/her offspring. Most cases are inherited from a parent, but approximately 10% of cases are de novo (an individual with a pathogenic variant has parents who do not have it). Identification of a pathogenic variant allows for the recognition of at-risk relatives who can pursue testing for the familial variant.  Family members are encouraged to consider genetic testing for this familial pathogenic variant. They may contact our office at [phone] for more information or to schedule an appointment.  Family members who live outside of the area are encouraged to find a genetic counselor in their area by visiting: [email or web address].  Family History Considerations:  The MEN1 gene mutation does not explain the family history of pancreatic adenocarcinoma. Ms. Lawniczak has a family history of pancreatic cancer in one first degree relative (father) and one second degree relative (paternal uncle). Per the International Cancer of the Pancreas Screening (CAPS) Consortium, individuals with a family history of pancreatic cancer in at least one first degree relative and one second degree relative should consider annual pancreatic cancer screening. We discussed a referral to [facility]  to discuss pancreatic cancer screening related to the family history.  She is not interested at this time; however, she stated she may consider as she gets closer to age 63 and learns about appropriate screening related to her MEN1  gene mutation.   Resources: American Multiple Endocrine Neoplasia Support: www.amensupport.org  Plan:  Referral to [facility] for appropriate workup and screening.  She denies any known features of hyperparathyroidism at this time.  Family letter provided previously.  She plans to communicate result with other relatives.  She knows daughters have a 50% chance of also having MEN1 gene mutation.     Questions were answered, and our contact information was provided.  We encourage Lakeisa Heninger to keep in contact with genetics to discuss evolving knowledge of MEN1 and appropriate genetic testing for this family throughout time.   Demauri Advincula M. Rennie Plowman, MS, [facility] Genetic Counselor [email or web address] (P) [phone]

## 2023-01-16 ENCOUNTER — Telehealth: Payer: Self-pay | Admitting: Genetic Counselor

## 2023-01-16 NOTE — Telephone Encounter (Signed)
Patient called requested referral to Dr. Sharl Ma in Endocrinology given her MEN1 mutation.  Referral faxed to Dr. Daune Perch office.

## 2023-01-31 ENCOUNTER — Other Ambulatory Visit: Payer: Self-pay | Admitting: Internal Medicine

## 2023-01-31 DIAGNOSIS — E3121 Multiple endocrine neoplasia [MEN] type I: Secondary | ICD-10-CM

## 2023-02-05 ENCOUNTER — Other Ambulatory Visit: Payer: Self-pay | Admitting: Internal Medicine

## 2023-02-05 DIAGNOSIS — Z800 Family history of malignant neoplasm of digestive organs: Secondary | ICD-10-CM

## 2023-02-05 DIAGNOSIS — E3120 Multiple endocrine neoplasia [MEN] syndrome, unspecified: Secondary | ICD-10-CM

## 2023-02-05 DIAGNOSIS — Z8349 Family history of other endocrine, nutritional and metabolic diseases: Secondary | ICD-10-CM

## 2023-02-11 ENCOUNTER — Other Ambulatory Visit: Payer: 59

## 2023-02-11 ENCOUNTER — Ambulatory Visit
Admission: RE | Admit: 2023-02-11 | Discharge: 2023-02-11 | Disposition: A | Discharge status: Home / Self Care | Payer: 59 | Source: Ambulatory Visit | Attending: Internal Medicine | Admitting: Internal Medicine

## 2023-02-11 DIAGNOSIS — E3121 Multiple endocrine neoplasia [MEN] type I: Secondary | ICD-10-CM

## 2023-02-11 MED ORDER — GADOPICLENOL 0.5 MMOL/ML IV SOLN: 6.0000 mL | Freq: Once | INTRAVENOUS | Status: DC | PRN

## 2023-02-11 MED ORDER — GADOPICLENOL 0.5 MMOL/ML IV SOLN
6.0000 mL | Freq: Once | INTRAVENOUS | Status: AC | PRN
  Administered 2023-02-11: 6 mL via INTRAVENOUS

## 2023-02-14 ENCOUNTER — Other Ambulatory Visit: Payer: 59

## 2023-02-25 ENCOUNTER — Ambulatory Visit
Admission: RE | Admit: 2023-02-25 | Discharge: 2023-02-25 | Disposition: A | Discharge status: Home / Self Care | Payer: 59 | Source: Ambulatory Visit | Attending: Internal Medicine | Admitting: Internal Medicine

## 2023-02-25 DIAGNOSIS — E3120 Multiple endocrine neoplasia [MEN] syndrome, unspecified: Secondary | ICD-10-CM

## 2023-02-25 DIAGNOSIS — Z800 Family history of malignant neoplasm of digestive organs: Secondary | ICD-10-CM

## 2023-02-25 DIAGNOSIS — Z8349 Family history of other endocrine, nutritional and metabolic diseases: Secondary | ICD-10-CM

## 2023-02-25 MED ORDER — IOPAMIDOL (ISOVUE-300) INJECTION 61%
500.0000 mL | Freq: Once | INTRAVENOUS | Status: AC | PRN
  Administered 2023-02-25: 100 mL via INTRAVENOUS

## 2024-01-07 ENCOUNTER — Encounter: Payer: Self-pay | Admitting: Internal Medicine

## 2024-01-07 ENCOUNTER — Other Ambulatory Visit: Payer: Self-pay | Admitting: Internal Medicine

## 2024-01-07 DIAGNOSIS — Z8349 Family history of other endocrine, nutritional and metabolic diseases: Secondary | ICD-10-CM

## 2024-01-07 DIAGNOSIS — Z800 Family history of malignant neoplasm of digestive organs: Secondary | ICD-10-CM

## 2024-01-07 DIAGNOSIS — R635 Abnormal weight gain: Secondary | ICD-10-CM

## 2024-01-07 DIAGNOSIS — R140 Abdominal distension (gaseous): Secondary | ICD-10-CM

## 2024-01-16 ENCOUNTER — Ambulatory Visit
Admission: RE | Admit: 2024-01-16 | Discharge: 2024-01-16 | Disposition: A | Discharge status: Home / Self Care | Source: Ambulatory Visit | Attending: Internal Medicine | Admitting: Internal Medicine

## 2024-01-16 DIAGNOSIS — Z8349 Family history of other endocrine, nutritional and metabolic diseases: Secondary | ICD-10-CM

## 2024-01-16 DIAGNOSIS — R635 Abnormal weight gain: Secondary | ICD-10-CM

## 2024-01-16 DIAGNOSIS — R140 Abdominal distension (gaseous): Secondary | ICD-10-CM

## 2024-01-16 DIAGNOSIS — Z800 Family history of malignant neoplasm of digestive organs: Secondary | ICD-10-CM

## 2024-01-16 MED ORDER — IOPAMIDOL (ISOVUE-370) INJECTION 76%
80.0000 mL | Freq: Once | INTRAVENOUS | Status: AC | PRN
  Administered 2024-01-16: 80 mL via INTRAVENOUS
